# Patient Record
Sex: Male | Born: 1955 | Race: Black or African American | Hispanic: No | Marital: Single | Smoking: Current every day smoker
Health system: Southern US, Community
[De-identification: ages and names within clinical notes are randomized; demographics above are authoritative.]

## PROBLEM LIST (undated history)

## (undated) DIAGNOSIS — W3400XA Accidental discharge from unspecified firearms or gun, initial encounter: Secondary | ICD-10-CM

## (undated) DIAGNOSIS — Y249XXA Unspecified firearm discharge, undetermined intent, initial encounter: Secondary | ICD-10-CM

## (undated) DIAGNOSIS — Z720 Tobacco use: Secondary | ICD-10-CM

## (undated) DIAGNOSIS — I10 Essential (primary) hypertension: Secondary | ICD-10-CM

---

## 2013-03-19 ENCOUNTER — Emergency Department (HOSPITAL_COMMUNITY)
Admission: EM | Admit: 2013-03-19 | Discharge: 2013-03-20 | Disposition: A | Discharge status: Home / Self Care | Payer: Medicare Other | Attending: Emergency Medicine | Admitting: Emergency Medicine

## 2013-03-19 ENCOUNTER — Encounter (HOSPITAL_COMMUNITY): Payer: Self-pay | Admitting: Emergency Medicine

## 2013-03-19 ENCOUNTER — Emergency Department (HOSPITAL_COMMUNITY): Payer: Medicare Other

## 2013-03-19 DIAGNOSIS — M25461 Effusion, right knee: Secondary | ICD-10-CM

## 2013-03-19 DIAGNOSIS — Z79899 Other long term (current) drug therapy: Secondary | ICD-10-CM | POA: Diagnosis not present

## 2013-03-19 DIAGNOSIS — M25469 Effusion, unspecified knee: Secondary | ICD-10-CM | POA: Diagnosis not present

## 2013-03-19 DIAGNOSIS — I10 Essential (primary) hypertension: Secondary | ICD-10-CM | POA: Diagnosis not present

## 2013-03-19 DIAGNOSIS — M25569 Pain in unspecified knee: Secondary | ICD-10-CM | POA: Diagnosis present

## 2013-03-19 NOTE — ED Notes (Signed)
Patient states his right knee started to have intermittent swelling x1 month with sharp pain 10/10. Patient states he was using the leg press machine when he felt like his leg was splitting open. No OTC's taken at home. He has only been applying muscle relaxant cream.

## 2013-03-20 MED ORDER — HYDROCODONE-ACETAMINOPHEN 5-325 MG PO TABS: 1.0000 | ORAL_TABLET | ORAL | Status: DC | PRN

## 2013-03-20 MED ORDER — LIDOCAINE HCL (PF) 1 % IJ SOLN
INTRAMUSCULAR | Status: AC
  Filled 2013-03-20: qty 5

## 2013-03-20 MED ORDER — LIDOCAINE HCL (PF) 1 % IJ SOLN
INTRAMUSCULAR | Status: AC
  Filled 2013-03-20: qty 20

## 2013-03-20 NOTE — ED Provider Notes (Signed)
Medical screening examination/treatment/procedure(s) were performed by non-physician practitioner and as supervising physician I was immediately available for consultation/collaboration.  EKG Interpretation   None         Enid SkeensJoshua M Kortland Nichols, MD 03/20/13 [phone]

## 2013-03-20 NOTE — Discharge Instructions (Signed)

## 2013-03-20 NOTE — ED Provider Notes (Signed)
CSN: [id number]     Arrival date & time 03/19/13  2207 History   First MD Initiated Contact with Patient 03/19/13 2233     Chief Complaint  Patient presents with  . Knee Pain   (Consider location/radiation/quality/duration/timing/severity/associated sxs/prior Treatment) HPI Comments: Patient is 58 year old male with history of HTN who presents to the ED with complaints of right knee pain and swelling progressively over the past month.  He denies any injury to the area, previous surgeries, artifical joints, fever, chills, erythema to the area.  Reports pain with flexion and ambulation.  Patient is a 58 y.o. male presenting with knee pain. The history is provided by the patient. No language interpreter was used.  Knee Pain Location:  Knee Time since incident:  1 month Injury: no   Knee location:  R knee Pain details:    Quality:  Pressure and dull   Radiates to:  Does not radiate   Severity:  Moderate   Onset quality:  Gradual   Duration:  1 month   Timing:  Constant   Progression:  Worsening Chronicity:  New Dislocation: no   Foreign body present:  No foreign bodies Tetanus status:  Up to date Prior injury to area:  No Relieved by:  Nothing Worsened by:  Nothing tried Ineffective treatments:  None tried Associated symptoms: decreased ROM, stiffness and swelling   Associated symptoms: no back pain, no fatigue, no fever, no itching, no muscle weakness, no neck pain, no numbness and no tingling     History reviewed. No pertinent past medical history. History reviewed. No pertinent past surgical history. No family history on file. History  Substance Use Topics  . Smoking status: Never Smoker   . Smokeless tobacco: Not on file  . Alcohol Use: No    Review of Systems  Constitutional: Negative for fever and fatigue.  Musculoskeletal: Positive for stiffness. Negative for back pain and neck pain.  Skin: Negative for itching.  All other systems reviewed and are  negative.    Allergies  Review of patient's allergies indicates no known allergies.  Home Medications   Current Outpatient Rx  Name  Route  Sig  Dispense  Refill  . PRESCRIPTION MEDICATION   Oral   Take 1 tablet by mouth daily. Patient takes a blood pressure med. He doesn't know the name.          BP 112/79  Pulse 64  Temp(Src) 97.5 F (36.4 C) (Oral)  Resp 16  SpO2 98% Physical Exam  Nursing note and vitals reviewed. Constitutional: He is oriented to person, place, and time. He appears well-developed and well-nourished. No distress.  HENT:  Head: Normocephalic.  Eyes: Conjunctivae are normal. No scleral icterus.  Pulmonary/Chest: Effort normal.  Musculoskeletal:       Right knee: He exhibits decreased range of motion, swelling and effusion. He exhibits no erythema, normal alignment, no LCL laxity, normal meniscus and no MCL laxity. Tenderness found. Lateral joint line tenderness noted.  Neurological: He is alert and oriented to person, place, and time. He exhibits normal muscle tone. Coordination normal.  Skin: Skin is warm and dry. No rash noted. No erythema. No pallor.  Psychiatric: He has a normal mood and affect. His behavior is normal. Judgment and thought content normal.    ED Course  Procedures (including critical care time) Labs Review Labs Reviewed - No data to display Imaging Review No results found.  EKG Interpretation   None      No results  found for this or any previous visit. Dg Knee Complete 4 Views Right  03/20/2013   CLINICAL DATA:  Right knee injury, with anterior knee pain and swelling.  EXAM: RIGHT KNEE - COMPLETE 4+ VIEW  COMPARISON:  None.  FINDINGS: There is no evidence of fracture or dislocation. The joint spaces are preserved. Small marginal osteophytes are seen arising from all three compartments.  A large knee joint effusion is noted. Focal soft tissue calcification overlying the medial quadriceps may reflect remote injury.  IMPRESSION:  1. No evidence of fracture or dislocation. 2. Large knee joint effusion. MRI of the right knee could be considered for further evaluation, to exclude internal derangement of the knee.   Electronically Signed   By: Roanna Raider M.D.   On: 03/20/2013 00:26    2:02 AM Patient with large right knee effusion, attempted to drain fluid from the knee.  He was prepped and draped in sterile position with knee slightly flexed.  I attempted to inject 2-3cc of 2% lidocaine to medial superior portion of the knee, patient repeatedly kept swatting my hand, breaking sterile field, so I stopped the procedure before ever entering the joint space, will place in knee immobilizer and offer pain medication and patient can follow up with orthopedics. MDM  Right knee effusion  Patient here with 1 month history of right knee pain and swelling, noted with large effusion both on x-ray and examination, patient uncooperative for arthocentesis, will have him follow up with ortho on outpatient basis.    Izola Price Marisue Humble, PA-C 03/20/13 0205

## 2013-03-20 NOTE — ED Notes (Signed)
Ortho tech paged and returned phone call.  Tech to come to FT to place knee immobilizer.

## 2013-08-13 ENCOUNTER — Encounter (HOSPITAL_COMMUNITY): Payer: Self-pay | Admitting: Emergency Medicine

## 2013-08-13 ENCOUNTER — Emergency Department (HOSPITAL_COMMUNITY): Payer: Medicare Other

## 2013-08-13 DIAGNOSIS — Z880 Allergy status to penicillin: Secondary | ICD-10-CM | POA: Insufficient documentation

## 2013-08-13 DIAGNOSIS — I10 Essential (primary) hypertension: Secondary | ICD-10-CM | POA: Insufficient documentation

## 2013-08-13 DIAGNOSIS — Z79899 Other long term (current) drug therapy: Secondary | ICD-10-CM | POA: Insufficient documentation

## 2013-08-13 DIAGNOSIS — M546 Pain in thoracic spine: Secondary | ICD-10-CM | POA: Insufficient documentation

## 2013-08-13 LAB — BASIC METABOLIC PANEL
BUN: 11 mg/dL (ref 6–23)
CHLORIDE: 99 meq/L (ref 96–112)
CO2: 23 mEq/L (ref 19–32)
Calcium: 9.5 mg/dL (ref 8.4–10.5)
Creatinine, Ser: 1.05 mg/dL (ref 0.50–1.35)
GFR calc non Af Amer: 76 mL/min — ABNORMAL LOW (ref >90)
GFR, EST AFRICAN AMERICAN: 89 mL/min — AB (ref >90)
Glucose, Bld: 90 mg/dL (ref 70–99)
POTASSIUM: 4.6 meq/L (ref 3.7–5.3)
Sodium: 137 mEq/L (ref 137–147)

## 2013-08-13 LAB — CBC
HCT: 41 % (ref 39.0–52.0)
Hemoglobin: 13.2 g/dL (ref 13.0–17.0)
MCH: 27.3 pg (ref 26.0–34.0)
MCHC: 32.2 g/dL (ref 30.0–36.0)
MCV: 84.9 fL (ref 78.0–100.0)
PLATELETS: 301 10*3/uL (ref 150–400)
RBC: 4.83 MIL/uL (ref 4.22–5.81)
RDW: 14.5 % (ref 11.5–15.5)
WBC: 7.8 10*3/uL (ref 4.0–10.5)

## 2013-08-13 LAB — I-STAT TROPONIN, ED: Troponin i, poc: 0 ng/mL (ref 0.00–0.08)

## 2013-08-13 LAB — PRO B NATRIURETIC PEPTIDE: Pro B Natriuretic peptide (BNP): 75.9 pg/mL (ref 0–125)

## 2013-08-13 NOTE — ED Notes (Signed)
Patient presents with c/o CP that radiates to his back.  Stated that he was vacuuming his floor and the pain started.  +SOB  States never hurt like this before

## 2013-08-14 ENCOUNTER — Emergency Department (HOSPITAL_COMMUNITY)
Admission: EM | Admit: 2013-08-14 | Discharge: 2013-08-14 | Disposition: A | Discharge status: Home / Self Care | Payer: Medicare Other | Attending: Emergency Medicine | Admitting: Emergency Medicine

## 2013-08-14 DIAGNOSIS — M549 Dorsalgia, unspecified: Secondary | ICD-10-CM

## 2013-08-14 HISTORY — DX: Essential (primary) hypertension: I10

## 2013-08-14 MED ORDER — TRAMADOL HCL 50 MG PO TABS: 50.0000 mg | ORAL_TABLET | Freq: Four times a day (QID) | ORAL | Status: DC | PRN

## 2013-08-14 MED ORDER — IBUPROFEN 600 MG PO TABS: 600.0000 mg | ORAL_TABLET | Freq: Four times a day (QID) | ORAL | Status: DC | PRN

## 2013-08-14 MED ORDER — TRAMADOL HCL 50 MG PO TABS
50.0000 mg | ORAL_TABLET | Freq: Once | ORAL | Status: AC
  Administered 2013-08-14: 50 mg via ORAL
  Filled 2013-08-14: qty 1

## 2013-08-14 NOTE — ED Notes (Signed)
Pt states new chest pain central radiating to upper back since this morning. No SOB N V.

## 2013-08-14 NOTE — ED Provider Notes (Signed)
CSN: [id number]     Arrival date & time 08/13/13  2056 History   First MD Initiated Contact with Patient 08/14/13 0056     Chief Complaint  Patient presents with  . Chest Pain     (Consider location/radiation/quality/duration/timing/severity/associated sxs/prior Treatment) HPI Patient is a 58 yo man who presents with complaints of mid back pain. He has pain in his upper thoracic region bilaterally. Pain is present only when the patient is in the supine position. He denies history of trauma or strain but, notes that he just drove himself back [place] [facility] [place] [facility]. Pain is aching, mild to moderately severe. The pain is nonradiating. She has no pain at this time. He states he is just tired.  The patient denies experiencing chest pain, shortness of breath, pleuritic pain, genitourinary symptoms, nausea, vomiting or diarrhea.  Past Medical History  Diagnosis Date  . Hypertension    History reviewed. No pertinent past surgical history. History reviewed. No pertinent family history. History  Substance Use Topics  . Smoking status: Never Smoker   . Smokeless tobacco: Not on file  . Alcohol Use: No    Review of Systems Ten point review of symptoms performed and is negative with the exception of symptoms noted above.     Allergies  Penicillins  Home Medications   Prior to Admission medications   Medication Sig Start Date End Date Taking? Authorizing Provider  PRESCRIPTION MEDICATION Take 1 tablet by mouth daily. Patient takes a blood pressure med. He doesn't know the name.   Yes Historical Provider, MD   BP 125/86  Pulse 55  Temp(Src) 98.7 F (37.1 C) (Oral)  Resp 18  Ht 6\' 2"  (1.88 m)  Wt 225 lb (102.059 kg)  BMI 28.88 kg/m2  SpO2 99% Physical Exam Gen: well developed and well nourished appearing Head: NCAT Eyes: PERL, EOMI Nose: no epistaixis or rhinorrhea Mouth/throat: mucosa is moist and pink Neck: supple, no stridor Lungs: CTA B, no wheezing, rhonchi or rales CV:  RRR, no murmur, extremities appear well perfused.  Abd: soft, notender, nondistended Back: no ttp, no cva ttp, ttp over the paraspinal musculature mid to upper back bilaterally.  Skin: warm and dry Ext: normal to inspection, no dependent edema Neuro: CN ii-xii grossly intact, no focal deficits Psyche; normal affect,  calm and cooperative.   ED Course  Procedures (including critical care time) Labs Review  Results for orders placed during the hospital encounter of 08/14/13 (from the past 24 hour(s))  CBC     Status: None   Collection Time    08/13/13  9:08 PM      Result Value Ref Range   WBC 7.8  4.0 - 10.5 K/uL   RBC 4.83  4.22 - 5.81 MIL/uL   Hemoglobin 13.2  13.0 - 17.0 g/dL   HCT 40.941.0  81.139.0 - 91.452.0 %   MCV 84.9  78.0 - 100.0 fL   MCH 27.3  26.0 - 34.0 pg   MCHC 32.2  30.0 - 36.0 g/dL   RDW 78.214.5  95.611.5 - 21.315.5 %   Platelets 301  150 - 400 K/uL  BASIC METABOLIC PANEL     Status: Abnormal   Collection Time    08/13/13  9:08 PM      Result Value Ref Range   Sodium 137  137 - 147 mEq/L   Potassium 4.6  3.7 - 5.3 mEq/L   Chloride 99  96 - 112 mEq/L   CO2 23  19 -  32 mEq/L   Glucose, Bld 90  70 - 99 mg/dL   BUN 11  6 - 23 mg/dL   Creatinine, Ser 1.611.05  0.50 - 1.35 mg/dL   Calcium 9.5  8.4 - 09.610.5 mg/dL   GFR calc non Af Amer 76 (*) >90 mL/min   GFR calc Af Amer 89 (*) >90 mL/min  PRO B NATRIURETIC PEPTIDE     Status: None   Collection Time    08/13/13  9:09 PM      Result Value Ref Range   Pro B Natriuretic peptide (BNP) 75.9  0 - 125 pg/mL  I-STAT TROPOININ, ED     Status: None   Collection Time    08/13/13  9:17 PM      Result Value Ref Range   Troponin i, poc 0.00  0.00 - 0.08 ng/mL   Comment 3             Imaging Review Dg Chest 2 View  08/13/2013   CLINICAL DATA:  Chest pain and shortness of breath.  EXAM: CHEST  2 VIEW  COMPARISON:  None.  FINDINGS: The lungs are well-aerated and clear. There is no evidence of focal opacification, pleural effusion or pneumothorax.   The heart is normal in size; the mediastinal contour is within normal limits. No acute osseous abnormalities are seen.  IMPRESSION: No acute cardiopulmonary process seen.   Electronically Signed   By: Roanna RaiderJeffery  Chang M.D.   On: 08/13/2013 21:55   EKG: nsr, no acute ischemic changes, normal intervals, normal axis, normal qrs complex  MDM   The patient appears to have myofascial pain which is likely secondary to recent long car ride. Pain is reproducible on exam. No signs of pneumonia on CXR. Signs and sx are not consistent with PE. No signs of ACS on EKG and troponin is wnl. No GU sx and no CVA ttp. Abd exam benign. Patient is stable for discharge. [facility] Return precautions discussed.     Brandt LoosenJulie Grisel Blumenstock, MD 08/14/13 [phone]

## 2013-08-14 NOTE — Discharge Instructions (Signed)
Back Pain, Adult  Back pain is very common. The pain often gets better over time. The cause of back pain is usually not dangerous. Most people can learn to manage their back pain on their own.   HOME CARE   · Stay active. Start with short walks on flat ground if you can. Try to walk farther each day.  · Do not sit, drive, or stand in one place for more than 30 minutes. Do not stay in bed.  · Do not avoid exercise or work. Activity can help your back heal faster.  · Be careful when you bend or lift an object. Bend at your knees, keep the object close to you, and do not twist.  · Sleep on a firm mattress. Lie on your side, and bend your knees. If you lie on your back, put a pillow under your knees.  · Only take medicines as told by your doctor.  · Put ice on the injured area.  · Put ice in a plastic bag.  · Place a towel between your skin and the bag.  · Leave the ice on for 15-20 minutes, 03-04 times a day for the first 2 to 3 days. After that, you can switch between ice and heat packs.  · Ask your doctor about back exercises or massage.  · Avoid feeling anxious or stressed. Find good ways to deal with stress, such as exercise.  GET HELP RIGHT AWAY IF:   · Your pain does not go away with rest or medicine.  · Your pain does not go away in 1 week.  · You have new problems.  · You do not feel well.  · The pain spreads into your legs.  · You cannot control when you poop (bowel movement) or pee (urinate).  · Your arms or legs feel weak or lose feeling (numbness).  · You feel sick to your stomach (nauseous) or throw up (vomit).  · You have belly (abdominal) pain.  · You feel like you may pass out (faint).  MAKE SURE YOU:   · Understand these instructions.  · Will watch your condition.  · Will get help right away if you are not doing well or get worse.  Document Released: 08/05/2007 Document Revised: 05/11/2011 Document Reviewed: 07/07/2010  [employer]® Patient Information ©2014 [employer].

## 2013-12-08 ENCOUNTER — Encounter (HOSPITAL_COMMUNITY): Payer: Self-pay | Admitting: Emergency Medicine

## 2013-12-08 ENCOUNTER — Emergency Department (HOSPITAL_COMMUNITY): Payer: Medicare Other

## 2013-12-08 ENCOUNTER — Inpatient Hospital Stay (HOSPITAL_COMMUNITY)
Admission: EM | Admit: 2013-12-08 | Discharge: 2013-12-11 | DRG: 287 | Disposition: A | Discharge status: Home / Self Care | Payer: Medicare Other | Attending: Internal Medicine | Admitting: Internal Medicine

## 2013-12-08 DIAGNOSIS — I4729 Other ventricular tachycardia: Secondary | ICD-10-CM

## 2013-12-08 DIAGNOSIS — Z720 Tobacco use: Secondary | ICD-10-CM | POA: Diagnosis present

## 2013-12-08 DIAGNOSIS — F1290 Cannabis use, unspecified, uncomplicated: Secondary | ICD-10-CM | POA: Diagnosis present

## 2013-12-08 DIAGNOSIS — I472 Ventricular tachycardia: Secondary | ICD-10-CM | POA: Diagnosis present

## 2013-12-08 DIAGNOSIS — R931 Abnormal findings on diagnostic imaging of heart and coronary circulation: Secondary | ICD-10-CM

## 2013-12-08 DIAGNOSIS — N1831 Chronic kidney disease, stage 3a: Secondary | ICD-10-CM

## 2013-12-08 DIAGNOSIS — R9431 Abnormal electrocardiogram [ECG] [EKG]: Secondary | ICD-10-CM

## 2013-12-08 DIAGNOSIS — R001 Bradycardia, unspecified: Secondary | ICD-10-CM | POA: Diagnosis present

## 2013-12-08 DIAGNOSIS — I10 Essential (primary) hypertension: Secondary | ICD-10-CM | POA: Diagnosis present

## 2013-12-08 DIAGNOSIS — I959 Hypotension, unspecified: Secondary | ICD-10-CM | POA: Diagnosis present

## 2013-12-08 DIAGNOSIS — I493 Ventricular premature depolarization: Secondary | ICD-10-CM | POA: Diagnosis present

## 2013-12-08 DIAGNOSIS — N179 Acute kidney failure, unspecified: Secondary | ICD-10-CM

## 2013-12-08 DIAGNOSIS — R55 Syncope and collapse: Principal | ICD-10-CM | POA: Diagnosis present

## 2013-12-08 DIAGNOSIS — Z9119 Patient's noncompliance with other medical treatment and regimen: Secondary | ICD-10-CM | POA: Diagnosis present

## 2013-12-08 DIAGNOSIS — I2510 Atherosclerotic heart disease of native coronary artery without angina pectoris: Secondary | ICD-10-CM | POA: Diagnosis present

## 2013-12-08 DIAGNOSIS — F17210 Nicotine dependence, cigarettes, uncomplicated: Secondary | ICD-10-CM | POA: Diagnosis present

## 2013-12-08 HISTORY — DX: Unspecified firearm discharge, undetermined intent, initial encounter: Y24.9XXA

## 2013-12-08 HISTORY — DX: Accidental discharge from unspecified firearms or gun, initial encounter: W34.00XA

## 2013-12-08 HISTORY — DX: Chronic kidney disease, stage 3a: N18.31

## 2013-12-08 HISTORY — DX: Tobacco use: Z72.0

## 2013-12-08 LAB — CBC WITH DIFFERENTIAL/PLATELET
BASOS PCT: 0 % (ref 0–1)
Basophils Absolute: 0 10*3/uL (ref 0.0–0.1)
EOS PCT: 1 % (ref 0–5)
Eosinophils Absolute: 0.1 10*3/uL (ref 0.0–0.7)
HCT: 44.7 % (ref 39.0–52.0)
Hemoglobin: 14.7 g/dL (ref 13.0–17.0)
Lymphocytes Relative: 38 % (ref 12–46)
Lymphs Abs: 2.1 10*3/uL (ref 0.7–4.0)
MCH: 27.3 pg (ref 26.0–34.0)
MCHC: 32.9 g/dL (ref 30.0–36.0)
MCV: 82.9 fL (ref 78.0–100.0)
MONO ABS: 0.4 10*3/uL (ref 0.1–1.0)
Monocytes Relative: 6 % (ref 3–12)
NEUTROS ABS: 3.1 10*3/uL (ref 1.7–7.7)
Neutrophils Relative %: 55 % (ref 43–77)
Platelets: 318 10*3/uL (ref 150–400)
RBC: 5.39 MIL/uL (ref 4.22–5.81)
RDW: 14.4 % (ref 11.5–15.5)
WBC: 5.6 10*3/uL (ref 4.0–10.5)

## 2013-12-08 LAB — CBC
HCT: 39.6 % (ref 39.0–52.0)
HEMOGLOBIN: 13.3 g/dL (ref 13.0–17.0)
MCH: 27.3 pg (ref 26.0–34.0)
MCHC: 33.6 g/dL (ref 30.0–36.0)
MCV: 81.1 fL (ref 78.0–100.0)
Platelets: 268 10*3/uL (ref 150–400)
RBC: 4.88 MIL/uL (ref 4.22–5.81)
RDW: 14.2 % (ref 11.5–15.5)
WBC: 9.4 10*3/uL (ref 4.0–10.5)

## 2013-12-08 LAB — BASIC METABOLIC PANEL
ANION GAP: 15 (ref 5–15)
BUN: 16 mg/dL (ref 6–23)
CALCIUM: 9.8 mg/dL (ref 8.4–10.5)
CO2: 22 mEq/L (ref 19–32)
CREATININE: 1.47 mg/dL — AB (ref 0.50–1.35)
Chloride: 101 mEq/L (ref 96–112)
GFR calc non Af Amer: 51 mL/min — ABNORMAL LOW (ref >90)
GFR, EST AFRICAN AMERICAN: 59 mL/min — AB (ref >90)
Glucose, Bld: 126 mg/dL — ABNORMAL HIGH (ref 70–99)
Potassium: 4.4 mEq/L (ref 3.7–5.3)
SODIUM: 138 meq/L (ref 137–147)

## 2013-12-08 LAB — I-STAT TROPONIN, ED: Troponin i, poc: 0.01 ng/mL (ref 0.00–0.08)

## 2013-12-08 LAB — RAPID URINE DRUG SCREEN, HOSP PERFORMED
AMPHETAMINES: NOT DETECTED
BENZODIAZEPINES: NOT DETECTED
Barbiturates: NOT DETECTED
Cocaine: NOT DETECTED
Opiates: NOT DETECTED
Tetrahydrocannabinol: POSITIVE — AB

## 2013-12-08 LAB — TROPONIN I

## 2013-12-08 LAB — CREATININE, SERUM
Creatinine, Ser: 1.12 mg/dL (ref 0.50–1.35)
GFR calc Af Amer: 82 mL/min — ABNORMAL LOW (ref >90)
GFR, EST NON AFRICAN AMERICAN: 71 mL/min — AB (ref >90)

## 2013-12-08 LAB — CBG MONITORING, ED: GLUCOSE-CAPILLARY: 136 mg/dL — AB (ref 70–99)

## 2013-12-08 MED ORDER — SODIUM CHLORIDE 0.9 % IV SOLN: INTRAVENOUS | Status: AC

## 2013-12-08 MED ORDER — GUAIFENESIN-DM 100-10 MG/5ML PO SYRP
5.0000 mL | ORAL_SOLUTION | ORAL | Status: DC | PRN
  Filled 2013-12-08: qty 5

## 2013-12-08 MED ORDER — SODIUM CHLORIDE 0.9 % IV BOLUS (SEPSIS)
1000.0000 mL | Freq: Once | INTRAVENOUS | Status: AC
  Administered 2013-12-08: 1000 mL via INTRAVENOUS

## 2013-12-08 MED ORDER — OXYCODONE HCL 5 MG PO TABS: 5.0000 mg | ORAL_TABLET | ORAL | Status: DC | PRN

## 2013-12-08 MED ORDER — ENOXAPARIN SODIUM 40 MG/0.4ML ~~LOC~~ SOLN
40.0000 mg | SUBCUTANEOUS | Status: DC
  Administered 2013-12-08 – 2013-12-10 (×3): 40 mg via SUBCUTANEOUS
  Filled 2013-12-08 (×5): qty 0.4

## 2013-12-08 MED ORDER — ACETAMINOPHEN 325 MG PO TABS: 650.0000 mg | ORAL_TABLET | Freq: Four times a day (QID) | ORAL | Status: DC | PRN

## 2013-12-08 MED ORDER — ONDANSETRON HCL 4 MG/2ML IJ SOLN: 4.0000 mg | Freq: Four times a day (QID) | INTRAMUSCULAR | Status: DC | PRN

## 2013-12-08 MED ORDER — ACETAMINOPHEN 650 MG RE SUPP: 650.0000 mg | Freq: Four times a day (QID) | RECTAL | Status: DC | PRN

## 2013-12-08 MED ORDER — SODIUM CHLORIDE 0.9 % IJ SOLN
3.0000 mL | Freq: Two times a day (BID) | INTRAMUSCULAR | Status: DC
  Administered 2013-12-09 – 2013-12-10 (×2): 3 mL via INTRAVENOUS

## 2013-12-08 MED ORDER — ASPIRIN EC 325 MG PO TBEC
325.0000 mg | DELAYED_RELEASE_TABLET | Freq: Every day | ORAL | Status: DC
  Administered 2013-12-08 – 2013-12-11 (×4): 325 mg via ORAL
  Filled 2013-12-08 (×5): qty 1

## 2013-12-08 MED ORDER — ALUM & MAG HYDROXIDE-SIMETH 200-200-20 MG/5ML PO SUSP: 30.0000 mL | Freq: Four times a day (QID) | ORAL | Status: DC | PRN

## 2013-12-08 MED ORDER — MORPHINE SULFATE 2 MG/ML IJ SOLN: 1.0000 mg | INTRAMUSCULAR | Status: DC | PRN

## 2013-12-08 MED ORDER — ALBUTEROL SULFATE (2.5 MG/3ML) 0.083% IN NEBU: 2.5000 mg | INHALATION_SOLUTION | RESPIRATORY_TRACT | Status: DC | PRN

## 2013-12-08 MED ORDER — SODIUM CHLORIDE 0.9 % IV SOLN
INTRAVENOUS | Status: DC
Start: 2013-12-08 — End: 2013-12-09
  Administered 2013-12-08: 20:00:00 via INTRAVENOUS

## 2013-12-08 MED ORDER — ONDANSETRON HCL 4 MG PO TABS: 4.0000 mg | ORAL_TABLET | Freq: Four times a day (QID) | ORAL | Status: DC | PRN
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
## 2013-12-08 NOTE — ED Notes (Signed)
Resident at bedside.  

## 2013-12-08 NOTE — ED Notes (Signed)
Per EMS, pt was at [facility] when he had a near syncopal episode. Pt A&OX4, NAD noted. Pt denies chest pain or shortness of breath. Pt has abrasions on right hand, pt did not hit head or no LOC. Pt's initial BP 80/60, P80, diaphoretic and cold. 12 lead unremarkable. Pt has h/o HTN.

## 2013-12-08 NOTE — ED Notes (Signed)
Patient transported to X-ray 

## 2013-12-09 ENCOUNTER — Encounter (HOSPITAL_COMMUNITY): Payer: Self-pay | Admitting: Internal Medicine

## 2013-12-09 DIAGNOSIS — R9431 Abnormal electrocardiogram [ECG] [EKG]: Secondary | ICD-10-CM

## 2013-12-09 DIAGNOSIS — N179 Acute kidney failure, unspecified: Secondary | ICD-10-CM | POA: Diagnosis present

## 2013-12-09 DIAGNOSIS — R55 Syncope and collapse: Secondary | ICD-10-CM | POA: Diagnosis present

## 2013-12-09 DIAGNOSIS — I959 Hypotension, unspecified: Secondary | ICD-10-CM | POA: Diagnosis present

## 2013-12-09 DIAGNOSIS — I2510 Atherosclerotic heart disease of native coronary artery without angina pectoris: Secondary | ICD-10-CM | POA: Diagnosis present

## 2013-12-09 DIAGNOSIS — I472 Ventricular tachycardia: Secondary | ICD-10-CM

## 2013-12-09 DIAGNOSIS — F17210 Nicotine dependence, cigarettes, uncomplicated: Secondary | ICD-10-CM | POA: Diagnosis present

## 2013-12-09 DIAGNOSIS — I517 Cardiomegaly: Secondary | ICD-10-CM

## 2013-12-09 DIAGNOSIS — Z9119 Patient's noncompliance with other medical treatment and regimen: Secondary | ICD-10-CM | POA: Diagnosis present

## 2013-12-09 DIAGNOSIS — I10 Essential (primary) hypertension: Secondary | ICD-10-CM | POA: Diagnosis present

## 2013-12-09 DIAGNOSIS — R931 Abnormal findings on diagnostic imaging of heart and coronary circulation: Secondary | ICD-10-CM

## 2013-12-09 DIAGNOSIS — I4729 Other ventricular tachycardia: Secondary | ICD-10-CM

## 2013-12-09 DIAGNOSIS — I493 Ventricular premature depolarization: Secondary | ICD-10-CM | POA: Diagnosis present

## 2013-12-09 DIAGNOSIS — F1290 Cannabis use, unspecified, uncomplicated: Secondary | ICD-10-CM | POA: Diagnosis present

## 2013-12-09 DIAGNOSIS — R001 Bradycardia, unspecified: Secondary | ICD-10-CM | POA: Diagnosis present

## 2013-12-09 LAB — BASIC METABOLIC PANEL
Anion gap: 11 (ref 5–15)
BUN: 14 mg/dL (ref 6–23)
CO2: 25 mEq/L (ref 19–32)
Calcium: 9 mg/dL (ref 8.4–10.5)
Chloride: 104 mEq/L (ref 96–112)
Creatinine, Ser: 1.09 mg/dL (ref 0.50–1.35)
GFR calc Af Amer: 85 mL/min — ABNORMAL LOW (ref >90)
GFR calc non Af Amer: 73 mL/min — ABNORMAL LOW (ref >90)
GLUCOSE: 107 mg/dL — AB (ref 70–99)
Potassium: 4.2 mEq/L (ref 3.7–5.3)
SODIUM: 140 meq/L (ref 137–147)

## 2013-12-09 LAB — LIPID PANEL
CHOL/HDL RATIO: 3.6 ratio
CHOLESTEROL: 159 mg/dL (ref 0–200)
HDL: 44 mg/dL (ref >39)
LDL Cholesterol: 99 mg/dL (ref 0–99)
Triglycerides: 80 mg/dL (ref <150)
VLDL: 16 mg/dL (ref 0–40)

## 2013-12-09 LAB — TROPONIN I: Troponin I: <0.3 ng/mL (ref <0.30)

## 2013-12-09 LAB — CBC
HCT: 39 % (ref 39.0–52.0)
Hemoglobin: 12.7 g/dL — ABNORMAL LOW (ref 13.0–17.0)
MCH: 26.3 pg (ref 26.0–34.0)
MCHC: 32.6 g/dL (ref 30.0–36.0)
MCV: 80.7 fL (ref 78.0–100.0)
PLATELETS: 263 10*3/uL (ref 150–400)
RBC: 4.83 MIL/uL (ref 4.22–5.81)
RDW: 14.1 % (ref 11.5–15.5)
WBC: 8.1 10*3/uL (ref 4.0–10.5)

## 2013-12-09 LAB — HEMOGLOBIN A1C
Hgb A1c MFr Bld: 6.1 % — ABNORMAL HIGH (ref <5.7)
MEAN PLASMA GLUCOSE: 128 mg/dL — AB (ref <117)

## 2013-12-09 LAB — TSH: TSH: 0.413 u[IU]/mL (ref 0.350–4.500)

## 2013-12-09 LAB — D-DIMER, QUANTITATIVE (NOT AT ARMC)

## 2013-12-09 MED ORDER — SODIUM CHLORIDE 0.9 % IJ SOLN: 3.0000 mL | INTRAMUSCULAR | Status: DC | PRN

## 2013-12-09 MED ORDER — ASPIRIN 81 MG PO CHEW
81.0000 mg | CHEWABLE_TABLET | ORAL | Status: AC
  Administered 2013-12-11: 81 mg via ORAL
  Filled 2013-12-09: qty 1

## 2013-12-09 MED ORDER — SODIUM CHLORIDE 0.9 % IV SOLN
1.0000 mL/kg/h | INTRAVENOUS | Status: DC
  Administered 2013-12-11: 1 mL/kg/h via INTRAVENOUS

## 2013-12-09 MED ORDER — SODIUM CHLORIDE 0.9 % IV SOLN: 250.0000 mL | INTRAVENOUS | Status: DC | PRN

## 2013-12-09 MED ORDER — SODIUM CHLORIDE 0.9 % IV SOLN: 1.0000 mL/kg/h | INTRAVENOUS | Status: DC

## 2013-12-09 MED ORDER — SODIUM CHLORIDE 0.9 % IJ SOLN
3.0000 mL | Freq: Two times a day (BID) | INTRAMUSCULAR | Status: DC
Start: 2013-12-09 — End: 2013-12-11
  Administered 2013-12-09 – 2013-12-10 (×3): 3 mL via INTRAVENOUS

## 2013-12-09 NOTE — Progress Notes (Signed)
[facility] PROGRESS NOTE  Jonathon BurnsHenry Harper ONG:[id number] DOB: [birth year] DOA: 12/08/2013 PCP: No PCP Per Patient  Assessment/Plan: Syncope  - possibly cardiac, EKG with new T-wave inversions in inferior and lateral leads -cardiac enzymes negative, orthostatics negative -given new EKG changes will request Cards eval -2D echo pending  . ARF (acute renal failure)  - Likely prerenal-hydrated, and resolved   . HTN (hypertension)  - Blood pressure currently stable  . Tobacco abuse  - Counseled regarding tobacco abuse.  Cannabis use  DVT proph: lovenox  Code Status: Full Code Family Communication: none at bedside Disposition Plan: home pending workup   Consultants:  Cards pending  HPI/Subjective: Feels ok, no chest pain, dyspnea  Objective: Filed Vitals:   12/09/13 0504  BP: 131/90  Pulse: 56  Temp: 98.9 F (37.2 C)  Resp: 17    Intake/Output Summary (Last 24 hours) at 12/09/13 0832 Last data filed at 12/09/13 0540  Gross per 24 hour  Intake    975 ml  Output    300 ml  Net    675 ml   Filed Weights   12/08/13 1838 12/09/13 0504  Weight: 92.398 kg (203 lb 11.2 oz) 92.244 kg (203 lb 5.8 oz)    Exam:   General:  AAOx3  Cardiovascular: S1S2/RRR  Respiratory: CTAB  Abdomen: soft, NT, BS present  Musculoskeletal: no edema c/c   Data Reviewed: Basic Metabolic Panel:  Recent Labs Lab 12/08/13 1515 12/08/13 2024 12/09/13 0116  NA 138  --  140  K 4.4  --  4.2  CL 101  --  104  CO2 22  --  25  GLUCOSE 126*  --  107*  BUN 16  --  14  CREATININE 1.47* 1.12 1.09  CALCIUM 9.8  --  9.0   Liver Function Tests: No results found for this basename: AST, ALT, ALKPHOS, BILITOT, PROT, ALBUMIN,  in the last 168 hours No results found for this basename: LIPASE, AMYLASE,  in the last 168 hours No results found for this basename: AMMONIA,  in the last 168 hours CBC:  Recent Labs Lab 12/08/13 1515 12/08/13 2024 12/09/13 0116  WBC 5.6 9.4 8.1   NEUTROABS 3.1  --   --   HGB 14.7 13.3 12.7*  HCT 44.7 39.6 39.0  MCV 82.9 81.1 80.7  PLT 318 268 263   Cardiac Enzymes:  Recent Labs Lab 12/08/13 2024 12/09/13 0116 12/09/13 0725  TROPONINI <0.30 <0.30 <0.30   BNP (last 3 results)  Recent Labs  08/13/13 2109  PROBNP 75.9   CBG:  Recent Labs Lab 12/08/13 1548  GLUCAP 136*    No results found for this or any previous visit (from the past 240 hour(s)).   Studies: Dg Chest 2 View  12/08/2013   CLINICAL DATA:  Syncope; hypertension  EXAM: CHEST  2 VIEW  COMPARISON:  August 13, 2013  FINDINGS: The lungs are clear. Heart size and pulmonary vascularity are normal. No adenopathy. No bone lesions.  IMPRESSION: No edema or consolidation.   Electronically Signed   By: Bretta BangWilliam  Harper M.D.   On: 12/08/2013 17:21    Scheduled Meds: . aspirin EC  325 mg Oral Daily  . enoxaparin (LOVENOX) injection  40 mg Subcutaneous Q24H  . sodium chloride  3 mL Intravenous Q12H   Continuous Infusions: . sodium chloride 75 mL/hr at 12/08/13 1952   Antibiotics Given (last 72 hours)   None      Principal Problem:   Syncope Active Problems:  ARF (acute renal failure)   HTN (hypertension)   Tobacco abuse    Time spent: 30min    [facility] HospitalJOSEPH,Naomii Kreger  [facility] Pager [phone]. If 7PM-7AM, please contact night-coverage at [email or web address], password [email or web address] 12/09/2013, 8:32 AM  LOS: 1 day

## 2013-12-09 NOTE — Progress Notes (Signed)
Utilization Review completed.  

## 2013-12-09 NOTE — ED Provider Notes (Signed)
I saw and evaluated the patient, reviewed the resident's note and I agree with the findings and plan.   .Face to face Exam:  General:  Awake HEENT:  Atraumatic Resp:  Normal effort Abd:  Nondistended Neuro:No focal weakness Lymph: No adenopathy   EKG is discussed and reviewed with resident  Nelia Shiobert L Maleiyah Releford, MD 12/09/13 2125

## 2013-12-09 NOTE — Progress Notes (Signed)
  Echocardiogram 2D Echocardiogram has been performed.  Janalyn HarderWest, Mayur Duman R 12/09/2013, 9:48 AM
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
## 2013-12-10 DIAGNOSIS — R931 Abnormal findings on diagnostic imaging of heart and coronary circulation: Secondary | ICD-10-CM

## 2013-12-10 MED ORDER — AMLODIPINE BESYLATE 5 MG PO TABS
5.0000 mg | ORAL_TABLET | Freq: Every day | ORAL | Status: DC
  Filled 2013-12-10: qty 1

## 2013-12-10 MED ORDER — AMLODIPINE BESYLATE 5 MG PO TABS
5.0000 mg | ORAL_TABLET | Freq: Two times a day (BID) | ORAL | Status: DC
  Administered 2013-12-10 – 2013-12-11 (×3): 5 mg via ORAL
  Filled 2013-12-10 (×4): qty 1

## 2013-12-10 NOTE — Progress Notes (Signed)
[facility] PROGRESS NOTE  Briscoe BurnsHenry Junod ZOX:[id number] DOB: Feb 03, 1956 DOA: 12/08/2013 PCP: No PCP Per Patient  Assessment/Plan: Syncope  - possibly cardiac, EKG with new T-wave inversions in inferior and lateral leads -cardiac enzymes negative, orthostatics negative -2D echo with EF 50% to 55% with hypokinesis of the basalinferolateral myocardium -[facility]  . ARF (acute renal failure)  - Likely prerenal-hydrated, and resolved   . HTN (hypertension)  - Blood pressure currently stable  . Tobacco abuse  - Counseled regarding tobacco abuse.  Cannabis use  DVT proph: lovenox  Code Status: Full Code Family Communication: none at bedside Disposition Plan: home pending workup   Consultants:  Cards pending  HPI/Subjective: Feels ok, no chest pain, dizziness  Objective: Filed Vitals:   12/10/13 0601  BP: 151/90  Pulse: 55  Temp: 98.6 F (37 C)  Resp: 20    Intake/Output Summary (Last 24 hours) at 12/10/13 1156 Last data filed at 12/10/13 0957  Gross per 24 hour  Intake      3 ml  Output      0 ml  Net      3 ml   Filed Weights   12/08/13 1838 12/09/13 0504 12/10/13 0601  Weight: 92.398 kg (203 lb 11.2 oz) 92.244 kg (203 lb 5.8 oz) 92.32 kg (203 lb 8.5 oz)    Exam:   General:  AAOx3  Cardiovascular: S1S2/RRR  Respiratory: CTAB  Abdomen: soft, NT, BS present  Musculoskeletal: no edema c/c   Data Reviewed: Basic Metabolic Panel:  Recent Labs Lab 12/08/13 1515 12/08/13 2024 12/09/13 0116  NA 138  --  140  K 4.4  --  4.2  CL 101  --  104  CO2 22  --  25  GLUCOSE 126*  --  107*  BUN 16  --  14  CREATININE 1.47* 1.12 1.09  CALCIUM 9.8  --  9.0   Liver Function Tests: No results found for this basename: AST, ALT, ALKPHOS, BILITOT, PROT, ALBUMIN,  in the last 168 hours No results found for this basename: LIPASE, AMYLASE,  in the last 168 hours No results found for this basename: AMMONIA,  in the last 168  hours CBC:  Recent Labs Lab 12/08/13 1515 12/08/13 2024 12/09/13 0116  WBC 5.6 9.4 8.1  NEUTROABS 3.1  --   --   HGB 14.7 13.3 12.7*  HCT 44.7 39.6 39.0  MCV 82.9 81.1 80.7  PLT 318 268 263   Cardiac Enzymes:  Recent Labs Lab 12/08/13 2024 12/09/13 0116 12/09/13 0725  TROPONINI <0.30 <0.30 <0.30   BNP (last 3 results)  Recent Labs  08/13/13 2109  PROBNP 75.9   CBG:  Recent Labs Lab 12/08/13 1548  GLUCAP 136*    No results found for this or any previous visit (from the past 240 hour(s)).   Studies: Dg Chest 2 View  12/08/2013   CLINICAL DATA:  Syncope; hypertension  EXAM: CHEST  2 VIEW  COMPARISON:  August 13, 2013  FINDINGS: The lungs are clear. Heart size and pulmonary vascularity are normal. No adenopathy. No bone lesions.  IMPRESSION: No edema or consolidation.   Electronically Signed   By: Bretta BangWilliam  Woodruff M.D.   On: 12/08/2013 17:21    Scheduled Meds: . amLODipine  5 mg Oral BID  . [START ON 12/11/2013] aspirin  81 mg Oral Pre-Cath  . aspirin EC  325 mg Oral Daily  . enoxaparin (LOVENOX) injection  40 mg Subcutaneous Q24H  . sodium chloride  3 mL Intravenous Q12H  . sodium chloride  3 mL Intravenous Q12H   Continuous Infusions: . [START ON 12/11/2013] sodium chloride     Antibiotics Given (last 72 hours)   None      Principal Problem:   Syncope Active Problems:   ARF (acute renal failure)   HTN (hypertension)   Tobacco abuse   NSVT (nonsustained ventricular tachycardia)   Abnormal ECG   Abnormal echocardiogram    Time spent: 30min    [facility] CenterJOSEPH,Michael Walrath  [facility] Pager [phone]. If 7PM-7AM, please contact night-coverage at [email or web address], password [email or web address] 12/10/2013, 11:56 AM  LOS: 2 days
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
## 2013-12-10 NOTE — Progress Notes (Deleted)
   Subjective: No CP No SOB  No dizziness Objective: Filed Vitals:   12/09/13 1530 12/09/13 2152 12/10/13 0034 12/10/13 0601  BP: 162/93 197/100 147/88 151/90  Pulse: 54 60 52 55  Temp: 98.6 F (37 C) 98.5 F (36.9 C)  98.6 F (37 C)  TempSrc: Oral Oral  Oral  Resp: 16 20  20   Height:      Weight:    203 lb 8.5 oz (92.32 kg)  SpO2: 99% 99%  100%   Weight change: -2.7 oz (-0.078 kg) No intake or output data in the 24 hours ending 12/10/13 0800  General: Alert, awake, oriented x3, in no acute distress Neck:  JVP is normal Heart: Regular rate and rhythm, without murmurs, rubs, gallops.  Lungs: Clear to auscultation.  No rales or wheezes. Exemities:  No edema.   Neuro: Grossly intact, nonfocal.  Tele:  SB, SR   Lab Results: Results for orders placed during the hospital encounter of 12/08/13 (from the past 24 hour(s))  D-DIMER, QUANTITATIVE     Status: None   Collection Time    12/09/13  9:50 AM      Result Value Ref Range   D-Dimer, Quant <0.27  0.00 - 0.48 ug/mL-FEU    Studies/Results: No results found.  Medications:  Reviewed   @PROBHOSP @  1.  Syncope    Patinet has had no recurrence.  Seen by D Bensimhon yesterday.  EKG with lateral T wave inversion (new)   Echo to suggest distal lateral hypokinesis.  Overall LVEF normal.   Will plan for L heart cath tomorrow to define, r/o CAD  2.  HTN  BP high  Was not on anything at home.  WIll increase amlodipine Give prn hydralzine x 1  3.  HCM  Lipids OK  HDL 44, LDL 99    LOS: 2 days   Dietrich PatesPaula Laqueisha Catalina 12/10/2013, 8:00 AM

## 2013-12-11 ENCOUNTER — Encounter (HOSPITAL_COMMUNITY)
Admission: EM | Disposition: A | Discharge status: Home / Self Care | Payer: Self-pay | Source: Home / Self Care | Attending: Internal Medicine

## 2013-12-11 DIAGNOSIS — R931 Abnormal findings on diagnostic imaging of heart and coronary circulation: Secondary | ICD-10-CM

## 2013-12-11 DIAGNOSIS — R55 Syncope and collapse: Secondary | ICD-10-CM

## 2013-12-11 HISTORY — PX: LEFT HEART CATHETERIZATION WITH CORONARY ANGIOGRAM: SHX5451

## 2013-12-11 LAB — PROTIME-INR
INR: 0.97 (ref 0.00–1.49)
PROTHROMBIN TIME: 12.9 s (ref 11.6–15.2)

## 2013-12-11 SURGERY — LEFT HEART CATHETERIZATION WITH CORONARY ANGIOGRAM
Anesthesia: LOCAL

## 2013-12-11 MED ORDER — AMLODIPINE BESYLATE 5 MG PO TABS: 5.0000 mg | ORAL_TABLET | Freq: Every day | ORAL | Status: DC

## 2013-12-11 MED ORDER — VERAPAMIL HCL 2.5 MG/ML IV SOLN
INTRAVENOUS | Status: AC
  Filled 2013-12-11: qty 2

## 2013-12-11 MED ORDER — LIDOCAINE HCL (PF) 1 % IJ SOLN
INTRAMUSCULAR | Status: AC
  Filled 2013-12-11: qty 30

## 2013-12-11 MED ORDER — SODIUM CHLORIDE 0.9 % IV SOLN
1.0000 mL/kg/h | INTRAVENOUS | Status: DC
  Administered 2013-12-11: 1 mL/kg/h via INTRAVENOUS

## 2013-12-11 MED ORDER — NITROGLYCERIN 1 MG/10 ML FOR IR/CATH LAB
INTRA_ARTERIAL | Status: AC
  Filled 2013-12-11: qty 10

## 2013-12-11 MED ORDER — FENTANYL CITRATE 0.05 MG/ML IJ SOLN
INTRAMUSCULAR | Status: AC
  Filled 2013-12-11: qty 2

## 2013-12-11 MED ORDER — HEPARIN (PORCINE) IN NACL 2-0.9 UNIT/ML-% IJ SOLN
INTRAMUSCULAR | Status: AC
  Filled 2013-12-11: qty 1000

## 2013-12-11 MED ORDER — MIDAZOLAM HCL 2 MG/2ML IJ SOLN
INTRAMUSCULAR | Status: AC
  Filled 2013-12-11: qty 2

## 2013-12-11 NOTE — Progress Notes (Signed)
Physician Discharge Summary  Jonathon Harper [id number] DOB: [birth year] DOA: 12/08/2013  PCP: No PCP Per Patient  Admit date: 12/08/2013 Discharge date: 12/11/2013  Time spent: 45 minutes  Recommendations for Outpatient Follow-up:  1. [facility]  Discharge Diagnoses:  Principal Problem:   Syncope Active Problems:   ARF (acute renal failure)   HTN (hypertension)   Tobacco abuse   NSVT (nonsustained ventricular tachycardia)   Abnormal ECG   Abnormal echocardiogram   Discharge Condition: stable  Diet recommendation: low sodium  Filed Weights   12/09/13 0504 12/10/13 0601 12/11/13 0525  Weight: 92.244 kg (203 lb 5.8 oz) 92.32 kg (203 lb 8.5 oz) 92.455 kg (203 lb 13.2 oz)    History of present illness:  Jonathon BurnsHenry Harper is a 58 y.o. male with a Past Medical History of hypertension-noncompliant to medications who presented to the ER after passing out. Per patient, he has a history of hypertension, [place]- he has not established care with her PCP yet. He claimed that he was at a car wash, when he experienced a brief syncopal episode, he may have lost consciousness for a few seconds. He was brought to the emergency room, where EKG showed new T-wave inversions in the inferior lateral beats  Hospital Course:  Syncope  - possibly vasovagal -But his EKG showed new T-wave inversions in inferior and lateral leads  -cardiac enzymes negative, orthostatics negative  -2D echo with EF 50% to 55% with hypokinesis of the basalinferolateral myocardium and hence underwent LHC which showed normal EF and mild non obstructive CAD  . ARF (acute renal failure)  - Likely prerenal-hydrated, and resolved   . HTN (hypertension)  - Blood pressure currently stable - started on Amlodipine   . Tobacco abuse  - Counseled regarding tobacco abuse.      Procedures: LHC: Left ventriculography: Left ventricular systolic function is normal, LVEF  is estimated at 55-65%, there is no significant mitral regurgitation  Final Conclusions:  1. Minor nonobstructive CAD  2. Normal LV function.       Consultations:  Cardiology  Discharge Exam: Filed Vitals:   12/11/13 0911  BP:   Pulse: 59  Temp:   Resp:     General: AAOx3 Cardiovascular: s1s2/RRR Respiratory: CTAB  Discharge Instructions You were cared for by a hospitalist during your hospital stay. If you have any questions about your discharge medications or the care you received while you were in the hospital after you are discharged, you can call the unit and asked to speak with the hospitalist on call if the hospitalist that took care of you is not available. Once you are discharged, your primary care physician will handle any further medical issues. Please note that NO REFILLS for any discharge medications will be authorized once you are discharged, as it is imperative that you return to your primary care physician (or establish a relationship with a primary care physician if you do not have one) for your aftercare needs so that they can reassess your need for medications and monitor your lab values.  Discharge Instructions   Diet - low sodium heart healthy    Complete by:  As directed      Increase activity slowly    Complete by:  As directed           Current Discharge Medication List    START taking these medications   Details  amLODipine (NORVASC) 5 MG tablet Take 1 tablet (5  mg total) by mouth daily. Qty: 30 tablet, Refills: 0       Allergies  Allergen Reactions  . Penicillins     Swells up   Follow-up Information   Follow up with PCP  In 2 weeks.       The results of significant diagnostics from this hospitalization (including imaging, microbiology, ancillary and laboratory) are listed below for reference.    Significant Diagnostic Studies: Dg Chest 2 View  12/08/2013   CLINICAL DATA:  Syncope; hypertension  EXAM: CHEST  2 VIEW  COMPARISON:   August 13, 2013  FINDINGS: The lungs are clear. Heart size and pulmonary vascularity are normal. No adenopathy. No bone lesions.  IMPRESSION: No edema or consolidation.   Electronically Signed   By: Bretta BangWilliam  Woodruff M.D.   On: 12/08/2013 17:21    Microbiology: No results found for this or any previous visit (from the past 240 hour(s)).   Labs: Basic Metabolic Panel:  Recent Labs Lab 12/08/13 1515 12/08/13 2024 12/09/13 0116  NA 138  --  140  K 4.4  --  4.2  CL 101  --  104  CO2 22  --  25  GLUCOSE 126*  --  107*  BUN 16  --  14  CREATININE 1.47* 1.12 1.09  CALCIUM 9.8  --  9.0   Liver Function Tests: No results found for this basename: AST, ALT, ALKPHOS, BILITOT, PROT, ALBUMIN,  in the last 168 hours No results found for this basename: LIPASE, AMYLASE,  in the last 168 hours No results found for this basename: AMMONIA,  in the last 168 hours CBC:  Recent Labs Lab 12/08/13 1515 12/08/13 2024 12/09/13 0116  WBC 5.6 9.4 8.1  NEUTROABS 3.1  --   --   HGB 14.7 13.3 12.7*  HCT 44.7 39.6 39.0  MCV 82.9 81.1 80.7  PLT 318 268 263   Cardiac Enzymes:  Recent Labs Lab 12/08/13 2024 12/09/13 0116 12/09/13 0725  TROPONINI <0.30 <0.30 <0.30   BNP: BNP (last 3 results)  Recent Labs  08/13/13 2109  PROBNP 75.9   CBG:  Recent Labs Lab 12/08/13 1548  GLUCAP 136*       Signed:  Ronny Ruddell  [facility] 12/11/2013, 12:13 PM
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
## 2013-12-11 NOTE — Discharge Instructions (Signed)
Radial Site Care °Refer to this sheet in the next few weeks. These instructions provide you with information on caring for yourself after your procedure. Your caregiver may also give you more specific instructions. Your treatment has been planned according to current medical practices, but problems sometimes occur. Call your caregiver if you have any problems or questions after your procedure. °HOME CARE INSTRUCTIONS °· You may shower the day after the procedure. Remove the bandage (dressing) and gently wash the site with plain soap and water. Gently pat the site dry. °· Do not apply powder or lotion to the site. °· Do not submerge the affected site in water for 3 to 5 days. °· Inspect the site at least twice daily. °· Do not flex or bend the affected arm for 24 hours. °· No lifting over 5 pounds (2.3 kg) for 5 days after your procedure. °· Do not drive home if you are discharged the same day of the procedure. Have someone else drive you. °· You may drive 24 hours after the procedure unless otherwise instructed by your caregiver. °· Do not operate machinery or power tools for 24 hours. °· A responsible adult should be with you for the first 24 hours after you arrive home. °What to expect: °· Any bruising will usually fade within 1 to 2 weeks. °· Blood that collects in the tissue (hematoma) may be painful to the touch. It should usually decrease in size and tenderness within 1 to 2 weeks. °SEEK IMMEDIATE MEDICAL CARE IF: °· You have unusual pain at the radial site. °· You have redness, warmth, swelling, or pain at the radial site. °· You have drainage (other than a small amount of blood on the dressing). °· You have chills. °· You have a fever or persistent symptoms for more than 72 hours. °· You have a fever and your symptoms suddenly get worse. °· Your arm becomes pale, cool, tingly, or numb. °· You have heavy bleeding from the site. Hold pressure on the site. °Document Released: 03/21/2010 Document Revised:  05/11/2011 Document Reviewed: 03/21/2010 °ExitCare® Patient Information ©2015 [employer]. This information is not intended to replace advice given to you by your health care provider. Make sure you discuss any questions you have with your health care provider. ° °

## 2013-12-11 NOTE — Interval H&P Note (Signed)
History and Physical Interval Note:  12/11/2013 9:12 AM  Jonathon BurnsHenry Harper  has presented today for surgery, with the diagnosis of unstable angina  The various methods of treatment have been discussed with the patient and family. After consideration of risks, benefits and other options for treatment, the patient has consented to  Procedure(s): LEFT HEART CATHETERIZATION WITH CORONARY ANGIOGRAM (N/A) as a surgical intervention .  The patient's history has been reviewed, patient examined, no change in status, stable for surgery.  I have reviewed the patient's chart and labs.  Questions were answered to the patient's satisfaction.   Cath Lab Visit (complete for each Cath Lab visit)  Clinical Evaluation Leading to the Procedure:   ACS: No.  Non-ACS:    Anginal Classification: No Symptoms  Anti-ischemic medical therapy: Minimal Therapy (1 class of medications)  Non-Invasive Test Results: Intermediate-risk stress test findings: cardiac mortality 1-3%/year  Prior CABG: No previous CABG        Theron AristaPeter Surgical Center For [facility] LLCJordanMD,FACC 12/11/2013 9:12 AM

## 2013-12-11 NOTE — CV Procedure (Signed)
    Cardiac Catheterization Procedure Note  Name: Jonathon Harper MRN: [id number] DOB: 1955/06/19  Procedure: Left Heart Cath, Selective Coronary Angiography, LV angiography  Indication: 58 yo BM with multiple cardiac risk factors presents with syncope. Echo shows inferolateral hypokinesis.    Procedural Details: The right wrist was prepped, draped, and anesthetized with 1% lidocaine. Using the modified Seldinger technique, a 6 French slender sheath was introduced into the right radial artery. 3 mg of verapamil was administered through the sheath, weight-based unfractionated heparin was administered intravenously. Standard Judkins catheters were used for selective coronary angiography and left ventriculography. Catheter exchanges were performed over an exchange length guidewire. There were no immediate procedural complications. A TR band was used for radial hemostasis at the completion of the procedure.  The patient was transferred to the post catheterization recovery area for further monitoring.  Procedural Findings: Hemodynamics: AO 147/85  mean 110 mm Hg LV 150/14 mm Hg  Coronary angiography: Coronary dominance: left  Left mainstem: Normal.   Left anterior descending (LAD): Mild irregularities less than 10%.   Left circumflex (LCx): 20% stenosis in the proximal LCx. 20% OM2.   Right coronary artery (RCA): small nondominant, normal.   Left ventriculography: Left ventricular systolic function is normal, LVEF is estimated at 55-65%, there is no significant mitral regurgitation   Final Conclusions:   1. Minor nonobstructive CAD 2. Normal LV function.   Recommendations: risk factor modification.  [name], MDFACC  12/11/2013, 9:34 AM
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
## 2013-12-11 NOTE — Care Management Note (Signed)
    Page 1 of 1   12/11/2013     11:48:40 AM CARE MANAGEMENT NOTE 12/11/2013  Patient:  Jonathon BurnsMOORER,Jonathon Harper   Account Number:  [id number]  Date Initiated:  12/11/2013  Documentation initiated by:  GRAVES-BIGELOW,Hayden Mabin  Subjective/Objective Assessment:   Pt admitted for syncope and cp.     Action/Plan:   Plan for d/c home. CM did provide pt with the Health Connect Number and pt to call for PCP. CM spoke to pt in ref to food stamps. Pt to call Social Services. No further needs from CM.   Anticipated DC Date:  12/11/2013   Anticipated DC Plan:  HOME/SELF CARE      DC Planning Services  CM consult      Choice offered to / List presented to:             Status of service:  Completed, signed off Medicare Important Message given?  YES (If response is "NO", the following Medicare IM given date fields will be blank) Date Medicare IM given:  12/11/2013 Medicare IM given by:  GRAVES-BIGELOW,Sha Amer Date Additional Medicare IM given:   Additional Medicare IM given by:    Discharge Disposition:  HOME/SELF CARE  Per UR Regulation:  Reviewed for med. necessity/level of care/duration of stay  If discussed at Long Length of Stay Meetings, dates discussed:    Comments:

## 2013-12-11 NOTE — Progress Notes (Signed)
TR Band removed and pressure dsg applied; RT radial level 0.  No immediate complications.  Advised pt to leave dsg on in place for 24 hours.  Radial site care reviewed with pt and on d/c instructions.

## 2014-02-08 ENCOUNTER — Encounter (HOSPITAL_COMMUNITY): Payer: Self-pay | Admitting: Cardiology

## 2014-10-17 ENCOUNTER — Other Ambulatory Visit: Payer: Self-pay | Admitting: Internal Medicine

## 2014-10-17 ENCOUNTER — Ambulatory Visit
Admission: RE | Admit: 2014-10-17 | Discharge: 2014-10-17 | Disposition: A | Discharge status: Home / Self Care | Payer: Medicare Other | Source: Ambulatory Visit | Attending: Internal Medicine | Admitting: Internal Medicine

## 2014-10-17 DIAGNOSIS — R0989 Other specified symptoms and signs involving the circulatory and respiratory systems: Secondary | ICD-10-CM

## 2015-03-23 ENCOUNTER — Emergency Department (HOSPITAL_COMMUNITY): Payer: Medicare HMO

## 2015-03-23 ENCOUNTER — Emergency Department (HOSPITAL_COMMUNITY)
Admission: EM | Admit: 2015-03-23 | Discharge: 2015-03-23 | Disposition: A | Discharge status: Home / Self Care | Payer: Medicare HMO | Attending: Emergency Medicine | Admitting: Emergency Medicine

## 2015-03-23 ENCOUNTER — Encounter (HOSPITAL_COMMUNITY): Payer: Self-pay | Admitting: *Deleted

## 2015-03-23 DIAGNOSIS — M25561 Pain in right knee: Secondary | ICD-10-CM | POA: Insufficient documentation

## 2015-03-23 DIAGNOSIS — Z880 Allergy status to penicillin: Secondary | ICD-10-CM | POA: Diagnosis not present

## 2015-03-23 DIAGNOSIS — Z79899 Other long term (current) drug therapy: Secondary | ICD-10-CM | POA: Insufficient documentation

## 2015-03-23 DIAGNOSIS — I10 Essential (primary) hypertension: Secondary | ICD-10-CM | POA: Diagnosis not present

## 2015-03-23 DIAGNOSIS — F17210 Nicotine dependence, cigarettes, uncomplicated: Secondary | ICD-10-CM | POA: Diagnosis not present

## 2015-03-23 DIAGNOSIS — Z87828 Personal history of other (healed) physical injury and trauma: Secondary | ICD-10-CM | POA: Insufficient documentation

## 2015-03-23 MED ORDER — IBUPROFEN 800 MG PO TABS: 800.0000 mg | ORAL_TABLET | Freq: Three times a day (TID) | ORAL | Status: DC

## 2015-03-23 NOTE — Discharge Instructions (Signed)
Take your medications as prescribed. I also recommend the elevating your right leg/knee and applying ice for 15-20 minutes 3-4 times daily. He may also apply ace wrap to right knee to help with swelling and pain. Follow-up with the orthopedic office listed above to schedule an appointment for this week. Return to the emergency department if symptoms worsen or new onset of fever, redness, warmth, swelling, drainage, decreased range of motion, numbness, tingling, weakness.   Emergency Department Resource Guide 1) Find a Doctor and Pay Out of Pocket Although you won't have to find out who is covered by your insurance plan, it is a good idea to ask around and get recommendations. You will then need to call the office and see if the doctor you have chosen will accept you as a new patient and what types of options they offer for patients who are self-pay. Some doctors offer discounts or will set up payment plans for their patients who do not have insurance, but you will need to ask so you aren't surprised when you get to your appointment.  2) Contact Your Local Health Department Not all health departments have doctors that can see patients for sick visits, but many do, so it is worth a call to see if yours does. If you don't know where your local health department is, you can check in your phone book. The CDC also has a tool to help you locate your state's health department, and many state websites also have listings of all of their local health departments.  3) Find a Walk-in Clinic If your illness is not likely to be very severe or complicated, you may want to try a walk in clinic. These are popping up all over the country in pharmacies, drugstores, and shopping centers. They're usually staffed by nurse practitioners or physician assistants that have been trained to treat common illnesses and complaints. They're usually fairly quick and inexpensive. However, if you have serious medical issues or chronic  medical problems, these are probably not your best option.  No Primary Care Doctor: - Call Health Connect at  [phone] - they can help you locate a primary care doctor that  accepts your insurance, provides certain services, etc. - Physician Referral Service- [phone]  Chronic Pain Problems: Organization         Address  Phone   Notes  [facility]  [phone] Patients need to be referred by their primary care doctor.   Medication Assistance: Organization         Address  Phone   Notes  [facility] Medication [facility] [address] [place] [place], [place] [address] [phone] --Must be a resident of [facility] -- Must have NO insurance coverage whatsoever (no Medicaid/ Medicare, etc.) -- The pt. MUST have a primary care doctor that directs their care regularly and follows them in the community   Med[facility]ist  [phone]   [employer]  [phone]    Agencies that provide inexpensive medical care: Organization         Address  Phone   Notes  [facility]  [phone]   [facility]    [phone]   [facility] [address] [place], [place] [address] [phone]   [facility] [address] [place] [place] [phone]   Planned Parenthood    [phone]   [facility]    [phone]  Community Health and [place]  [place] [address], [place] Phone:  [phone], Fax:  [phone] Hours of Operation:  9 am - 6 pm, M-F.  Also accepts Medicaid/Medicare and self-pay.  [facility] for [place], [address], [place] Phone: [phone], Fax: [phone]. Hours of Operation:  8:30 am - 5:30 pm, M-F.  Also accepts Medicaid and self-pay.  [facility] [place] [address] [place] Phone: [phone]   [place], [place], [place] [phone],  Ext. 123 Mondays & Thursdays: 7-9 AM.  First 15 patients are seen on a first come, first serve basis.    [place] Providers:  Organization         Address  Phone   Notes  [facility] [address], [place] [phone] Also accepts self-pay patients.  [facility] [id number] [place], [place]  [phone]   [place], [address], [place] [phone]   [facility] [address], [place] [phone]   Lucianne Lei [address], [place]   [phone] Only accepts [place] Access [place] patients after they have their name applied to their card.   Self-Pay (no insurance) in [facility]:  Organization         Address  Phone   Notes  Sickle Cell Patients, [facility] [place] [phone]   [facility] Urgent Care [place] [phone]   [facility] [place]  [place], [place], [place] [phone]   [facility]/Dr. Osei-Bonsu  [address] [place] [address], [place] [phone] Phone number for both [place] and [place] locations is the same.  Urgent Medical and [facility] [address] [place] [phone]   [facility] [address], [place] or [address] [phone]   [facility] [address], [place] [phone], phone; [phone], fax Sees patients 1st and 3rd Saturday of every month.  Must not qualify for public or private insurance (i.e. Medicaid, Medicare, [place] Health Choice, Veterans' Benefits)  Household income should be no more than 200% of the poverty level The clinic cannot treat you if you are pregnant or think you are pregnant  Sexually transmitted diseases are not  treated at the clinic.    Dental Care: Organization         Address  Phone  Notes  [facility] Department of [facility] [place] [phone] Accepts children up to age 60 who are enrolled in [place] or [place]; pregnant women with a Medicaid card; and children who have applied for Medicaid or [place] Health Choice, but were declined, whose parents can pay a reduced fee at time of service.  [facility] Department of [facility]  [address], [place] [phone] Accepts children up to age 20 who are enrolled in [place] or [place]; pregnant women with a Medicaid card; and children who have applied for Medicaid or [place] Health Choice, but were declined, whose parents can pay a reduced fee at time of service.  [place]  [phone]  [place] [phone] Patients are seen by appointment only. Walk-ins are not accepted. [place] will see patients 19 years of age and older. Monday - Tuesday (8am-5pm) Most Wednesdays (8:30-5pm) $30 per visit, cash only  [facility] Adult Dental Access PROGRAM  [address], [facility] [phone] Patients are seen by appointment only. Walk-ins are not accepted. [place] will see patients 68 years of age and older. One Wednesday Evening (Monthly: Volunteer Based).  $30 per visit, cash only  [place]  [phone] for adults; Children under age 42, call Graduate Pediatric Dentistry at [phone]. Children aged 50-14, please call [phone] to request a pediatric application.  Dental services are provided in all areas of dental care including fillings, crowns and bridges, complete and partial dentures, implants, gum treatment, root canals, and extractions. Preventive care is also provided. Treatment is provided to both adults and children. Patients are selected via a lottery and there is  often a waiting list.   [facility] [address] [place]  [phone] [email or web address]   [facility] [address] [place], [place] [phone], Ext. [phone] Second and Fourth Thursday of each month, opens at 6:30 AM; Clinic ends at 9 AM.  Patients are seen on a first-come first-served basis, and a limited number are seen during each clinic.   [facility]  [address] Hillard Danker [place], [place] [phone]   Eligibility Requirements You must have lived in [place], [place], or [place] counties for at least the last three months.   You cannot be eligible for state or federal sponsored [employer], including [employer], [place], or [employer].   You generally cannot be eligible for healthcare insurance through your employer.    How to apply: Eligibility screenings are held every Tuesday and Wednesday afternoon from 1:00 pm until 4:00 pm. You do not need an appointment for the interview!  [facility] [address] [place], [place]   [place]  [place] Department  [place]  [phone]    Behavioral Health Resources in the Community: Intensive Outpatient Programs Organization         Address  Phone  Notes  [place]. [address] [place], [place] [phone]   [facility] Outpatient [address], [place], [place]   ADS: Alcohol & Drug Svcs [address], [place], [place]   [place] [address]  [place], [place] or [phone]   Substance Abuse Resources Organization         Address  Phone  Notes  Alcohol and Drug Services  [phone]   [place]  [phone]   [facility] [place]   Chinita Pester  [phone]   Residential & Outpatient Substance  Abuse Program  [phone]   Psychological Services Organization         Address  Phone  Notes  [facility] [place]  [place]  [phone]   [place] [address] [place] or [phone]    Mobile Crisis Teams Organization         Address  Phone  Notes  [facility], Mobile Crisis Care Unit  [phone]   Assertive Psychotherapeutic Services  [address] [place], [place]  [facility] [address] [place] [phone]    Self-Help/Support Groups Organization         Address  Phone             Notes  [facility]ental Health [facility]oc[facility] of [place] - variety of support groups  [phone]- I7437963 Call for more information  Narcotics Anonymous (NA), Caring [facility] [address], [employer] [place]  2 meetings at this location   Statistician         Address  Phone  Notes  [facility] [address],    [place]  [phone]   [facility]  [address] [place] [address], [place], [place] [phone]   [facility] [address] [place], [place] [phone] Admissions: 8am-3pm M-F  [facility] [address],    [place], [place] [phone]   [facility] [address] [place], [place], [place] [phone]   [facility] [address],  [place], [place] [phone]   [facility] - Intensive Outpatient [address] Laurell [address], [place], [place] [phone]   [facility] (Addiction Recovery Care [facility] [address] [place].,  [place], [place] [phone] or [phone]   [facility] Services (RT[facility]) [address] [place], [place] [phone] Accepts Medicaid  [facility] [place] [address]  [place] [phone] Substance Abuse/Addiction Treatment   [facility] Organization          Address  Phone  Notes  [facility]  [phone]   Angie Fava, PhD [address] Ervin Knack [place], [place]   [phone] or [phone]   [facility]al   [address] [place], [place] [phone]   [facility] [address] [place], [place] [phone] Insurance/Medicaid/sponsorship through [facility] and Families [address]                                    [place], [place] [phone] Therapy/tele-psych/case  [facility] [address], [place] [phone]    Dr. Lolly Mustache  [phone]   [facility]  [employer] [facility] Dept. 1) [address], [place] 2) [address] [place] 3)  [address], [place] [phone]  [phone]   [facility] Child Abuse Hotline [phone] or [phone] (After Hours)

## 2015-03-23 NOTE — ED Notes (Signed)
Pt repots right knee pain and swelling x 1 week. Hx of same. Ambulatory at triage.
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
## 2015-03-23 NOTE — ED Notes (Signed)
Declined W/C at D/C and was escorted to lobby by RN. 

## 2015-03-23 NOTE — ED Notes (Signed)
Gave pt a pair of shorts to put on. Pt only took off his jeans and left his long johns on. Tech told pt in order for provider to properly assess him he needed to take the long johns off. Pt stated that he was "too cold" and that they would pull up. Tech had given him warm blankets and turned the heat up for him but pt still refused.

## 2017-04-18 ENCOUNTER — Emergency Department (HOSPITAL_COMMUNITY)
Admission: EM | Admit: 2017-04-18 | Discharge: 2017-04-18 | Disposition: A | Discharge status: Home / Self Care | Payer: Medicare Other | Attending: Emergency Medicine | Admitting: Emergency Medicine

## 2017-04-18 ENCOUNTER — Encounter (HOSPITAL_COMMUNITY): Payer: Self-pay

## 2017-04-18 ENCOUNTER — Emergency Department (HOSPITAL_COMMUNITY): Payer: Medicare Other

## 2017-04-18 DIAGNOSIS — Z79899 Other long term (current) drug therapy: Secondary | ICD-10-CM | POA: Diagnosis not present

## 2017-04-18 DIAGNOSIS — R2241 Localized swelling, mass and lump, right lower limb: Secondary | ICD-10-CM | POA: Diagnosis present

## 2017-04-18 DIAGNOSIS — I10 Essential (primary) hypertension: Secondary | ICD-10-CM | POA: Insufficient documentation

## 2017-04-18 DIAGNOSIS — F17210 Nicotine dependence, cigarettes, uncomplicated: Secondary | ICD-10-CM | POA: Insufficient documentation

## 2017-04-18 DIAGNOSIS — G8929 Other chronic pain: Secondary | ICD-10-CM | POA: Diagnosis not present

## 2017-04-18 DIAGNOSIS — M25561 Pain in right knee: Secondary | ICD-10-CM | POA: Insufficient documentation

## 2017-04-18 DIAGNOSIS — M25461 Effusion, right knee: Secondary | ICD-10-CM | POA: Diagnosis not present

## 2017-04-18 MED ORDER — DICLOFENAC SODIUM 1 % TD GEL: 2.0000 g | Freq: Four times a day (QID) | TRANSDERMAL | 0 refills | Status: DC

## 2017-04-18 NOTE — ED Triage Notes (Signed)
Rt knee x-ray was done in room
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
## 2017-04-18 NOTE — Discharge Instructions (Signed)
It was my pleasure taking care of you today!   Please see the information and instructions below regarding your visit.  Your diagnoses today include:  1. Effusion of right knee    About diagnosis.  Tests performed today include: See side panel of your discharge paperwork for testing performed today. Vital signs are listed at the bottom of these instructions.   Xray demonstrated fluid in your knee  Medications prescribed:    Take any prescribed medications only as prescribed, and any over the counter medications only as directed on the packaging.  Diclofenac gel as needed for pain. Use crutches as needed for comfort. Ice and elevate knee throughout the day.  Ibuprofen and alcohol followed well.  Please use one or the other.  Home care instructions:  Please follow any educational materials contained in this packet.   Follow-up instructions: Please follow-up with your primary care provider as soon as possible for further evaluation of your symptoms if they are not completely improved.  Please call the patient care center at 9 AM on Tuesday.  They will not be open tomorrow on February 18.  You can set up a primary care provider through them.  Call the orthopedist listed today or tomorrow to schedule follow up appointment for recheck of ongoing knee pain in one to two weeks. That appointment can be canceled with a 24-48 hour notice if complete resolution of pain.   Follow up with the orthopedist listed if symptoms do not improve in one week.   Return instructions:  Please return to the Emergency Department if you experience worsening symptoms.  Please return for any increasing swelling, increasing pain, loss of color to the lower leg, or increasing redness. Please return if you have any other emergent concerns.  Additional Information:   Your vital signs today were: BP 127/87 (BP Location: Right Arm)    Pulse (!) 58    Temp 98.4 F (36.9 C) (Oral)    Resp 12    Ht 6\' 2"  (1.88 m)     Wt 101.6 kg (224 lb)    SpO2 98%    BMI 28.76 kg/m  If your blood pressure (BP) was elevated on multiple readings during this visit above 130 for the top number or above 80 for the bottom number, please have this repeated by your primary care provider within one month. --------------

## 2017-04-18 NOTE — ED Notes (Signed)
Declined W/C at D/C and was escorted to lobby by RN. 

## 2017-04-18 NOTE — ED Triage Notes (Signed)
Patient complains of recurrent right knee swelling x 2 weeks, denies trauma

## 2018-07-11 ENCOUNTER — Encounter (HOSPITAL_COMMUNITY): Payer: Self-pay | Admitting: *Deleted

## 2018-07-11 ENCOUNTER — Other Ambulatory Visit: Payer: Self-pay

## 2018-07-11 ENCOUNTER — Ambulatory Visit (HOSPITAL_COMMUNITY)
Admission: EM | Admit: 2018-07-11 | Discharge: 2018-07-11 | Disposition: A | Discharge status: Home / Self Care | Payer: Medicaid Other | Attending: Family Medicine | Admitting: Family Medicine

## 2018-07-11 DIAGNOSIS — M7989 Other specified soft tissue disorders: Secondary | ICD-10-CM

## 2018-07-11 MED ORDER — TRIAMCINOLONE ACETONIDE 0.1 % EX CREA: 1.0000 "application " | TOPICAL_CREAM | Freq: Two times a day (BID) | CUTANEOUS | 0 refills | Status: DC

## 2018-07-11 NOTE — ED Triage Notes (Signed)
Reports noticing swelling and discomfort to right forearm and hand onset 3 days ago.  No lesions noted.  CMS intact.  Denies fevers or injury.

## 2018-07-11 NOTE — Discharge Instructions (Addendum)
Use the cream as directed.  Follow up as needed for continued or worsening symptoms
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
## 2018-08-22 ENCOUNTER — Encounter: Payer: Self-pay | Admitting: Family Medicine

## 2018-08-22 ENCOUNTER — Ambulatory Visit (INDEPENDENT_AMBULATORY_CARE_PROVIDER_SITE_OTHER): Payer: Medicare HMO | Admitting: Family Medicine

## 2018-08-22 ENCOUNTER — Other Ambulatory Visit: Payer: Self-pay

## 2018-08-22 DIAGNOSIS — M25561 Pain in right knee: Secondary | ICD-10-CM
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
## 2018-09-25 ENCOUNTER — Emergency Department (HOSPITAL_COMMUNITY)
Admission: EM | Admit: 2018-09-25 | Discharge: 2018-09-25 | Disposition: A | Discharge status: Home / Self Care | Payer: Medicare HMO | Attending: Emergency Medicine | Admitting: Emergency Medicine

## 2018-09-25 ENCOUNTER — Emergency Department (HOSPITAL_COMMUNITY): Payer: Medicare HMO

## 2018-09-25 ENCOUNTER — Other Ambulatory Visit: Payer: Self-pay

## 2018-09-25 ENCOUNTER — Encounter (HOSPITAL_COMMUNITY): Payer: Self-pay | Admitting: Emergency Medicine

## 2018-09-25 DIAGNOSIS — M25561 Pain in right knee: Secondary | ICD-10-CM | POA: Insufficient documentation

## 2018-09-25 DIAGNOSIS — Y999 Unspecified external cause status: Secondary | ICD-10-CM | POA: Insufficient documentation

## 2018-09-25 DIAGNOSIS — Y92410 Unspecified street and highway as the place of occurrence of the external cause: Secondary | ICD-10-CM | POA: Diagnosis not present

## 2018-09-25 DIAGNOSIS — I10 Essential (primary) hypertension: Secondary | ICD-10-CM | POA: Insufficient documentation

## 2018-09-25 DIAGNOSIS — M25512 Pain in left shoulder: Secondary | ICD-10-CM | POA: Insufficient documentation

## 2018-09-25 DIAGNOSIS — F17210 Nicotine dependence, cigarettes, uncomplicated: Secondary | ICD-10-CM | POA: Diagnosis not present

## 2018-09-25 DIAGNOSIS — Y9389 Activity, other specified: Secondary | ICD-10-CM | POA: Insufficient documentation

## 2018-09-25 DIAGNOSIS — Z79899 Other long term (current) drug therapy: Secondary | ICD-10-CM | POA: Insufficient documentation

## 2018-09-25 DIAGNOSIS — M7918 Myalgia, other site: Secondary | ICD-10-CM

## 2018-09-25 DIAGNOSIS — R51 Headache: Secondary | ICD-10-CM | POA: Diagnosis not present

## 2018-09-25 DIAGNOSIS — F10220 Alcohol dependence with intoxication, uncomplicated: Secondary | ICD-10-CM | POA: Diagnosis not present

## 2018-09-25 DIAGNOSIS — F10920 Alcohol use, unspecified with intoxication, uncomplicated: Secondary | ICD-10-CM
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
## 2018-09-25 NOTE — ED Notes (Signed)
The patient is too intoxicated to ambulate at this time

## 2018-09-25 NOTE — ED Notes (Signed)
Patient was able to ambulate up and down the hallway with ease.
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
## 2018-09-25 NOTE — ED Notes (Signed)
Patient verbalizes understanding of discharge instructions. Opportunity for questioning and answers were provided. Armband removed by staff, pt discharged from ED.  
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
## 2018-11-09 ENCOUNTER — Encounter: Payer: Self-pay | Admitting: Nurse Practitioner

## 2018-11-16 ENCOUNTER — Ambulatory Visit: Payer: Medicare HMO | Admitting: Nurse Practitioner

## 2019-07-05 ENCOUNTER — Encounter: Payer: Self-pay | Admitting: Physician Assistant

## 2019-07-05 ENCOUNTER — Ambulatory Visit (INDEPENDENT_AMBULATORY_CARE_PROVIDER_SITE_OTHER): Payer: Medicare HMO | Admitting: Physician Assistant

## 2019-07-05 ENCOUNTER — Other Ambulatory Visit: Payer: Self-pay

## 2019-07-05 ENCOUNTER — Ambulatory Visit (INDEPENDENT_AMBULATORY_CARE_PROVIDER_SITE_OTHER): Payer: Medicare HMO

## 2019-07-05 DIAGNOSIS — M25561 Pain in right knee: Secondary | ICD-10-CM

## 2019-07-05 DIAGNOSIS — M1711 Unilateral primary osteoarthritis, right knee: Secondary | ICD-10-CM

## 2019-07-05 MED ORDER — LIDOCAINE HCL 1 % IJ SOLN
3.0000 mL | INTRAMUSCULAR | Status: AC | PRN
  Administered 2019-07-05: 3 mL

## 2019-07-05 MED ORDER — METHYLPREDNISOLONE ACETATE 40 MG/ML IJ SUSP
40.0000 mg | INTRAMUSCULAR | Status: AC | PRN
  Administered 2019-07-05: 17:00:00 40 mg via INTRA_ARTICULAR
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
## 2019-07-19 ENCOUNTER — Ambulatory Visit: Payer: Medicare HMO | Admitting: Physician Assistant

## 2020-06-24 ENCOUNTER — Ambulatory Visit: Payer: Medicare HMO | Admitting: Physician Assistant

## 2020-08-01 ENCOUNTER — Ambulatory Visit: Payer: Medicare Other | Admitting: Physician Assistant

## 2021-01-06 ENCOUNTER — Other Ambulatory Visit: Payer: Self-pay

## 2021-01-06 ENCOUNTER — Ambulatory Visit (HOSPITAL_COMMUNITY)
Admission: EM | Admit: 2021-01-06 | Discharge: 2021-01-06 | Disposition: A | Discharge status: Home / Self Care | Payer: Medicare Other | Attending: Emergency Medicine | Admitting: Emergency Medicine

## 2021-01-06 ENCOUNTER — Encounter (HOSPITAL_COMMUNITY): Payer: Self-pay | Admitting: Emergency Medicine

## 2021-01-06 ENCOUNTER — Ambulatory Visit (INDEPENDENT_AMBULATORY_CARE_PROVIDER_SITE_OTHER): Payer: Medicare Other

## 2021-01-06 DIAGNOSIS — W19XXXA Unspecified fall, initial encounter: Secondary | ICD-10-CM | POA: Diagnosis not present

## 2021-01-06 DIAGNOSIS — S2241XA Multiple fractures of ribs, right side, initial encounter for closed fracture: Secondary | ICD-10-CM

## 2021-01-06 DIAGNOSIS — R079 Chest pain, unspecified: Secondary | ICD-10-CM

## 2021-01-06 MED ORDER — IBUPROFEN 800 MG PO TABS
800.0000 mg | ORAL_TABLET | Freq: Three times a day (TID) | ORAL | 0 refills | Status: AC
Start: 2021-01-06 — End: ?

## 2021-01-06 MED ORDER — TIZANIDINE HCL 2 MG PO TABS: 2.0000 mg | ORAL_TABLET | Freq: Two times a day (BID) | ORAL | 0 refills | Status: DC | PRN

## 2021-01-06 NOTE — Discharge Instructions (Addendum)
Your x-ray today shows a fracture sixth and seventh rib on the right side  These will slowly heal over time and can take months to do so  There is currently no complication of the lungs  Please watch for signs of difficulty breathing, shortness of breath, increased pain with breathing, if these occur at any point please go to the nearest emergency department for further evaluation  May use Zanaflex twice a day, be mindful this may make you drowsy   May use ibuprofen as needed every 8 hours as need  You may wear an abdominal binder as needed for additional support  To help prevent complications please complete deep breathing exercises at least once a day, take deep breath, hold for 10 seconds, slowly exhale through pursed lips, complete 10 times

## 2021-01-06 NOTE — ED Triage Notes (Signed)
Pt reports on Saturday he passed out and fell to ground. C/o right sided rib pains esp with movement and taking a deep breath
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
## 2022-04-06 ENCOUNTER — Other Ambulatory Visit: Payer: Self-pay | Admitting: Student

## 2022-04-06 DIAGNOSIS — F17210 Nicotine dependence, cigarettes, uncomplicated: Secondary | ICD-10-CM

## 2022-04-21 ENCOUNTER — Emergency Department (HOSPITAL_COMMUNITY): Payer: Medicare HMO

## 2022-04-21 ENCOUNTER — Emergency Department (HOSPITAL_COMMUNITY)
Admission: EM | Admit: 2022-04-21 | Discharge: 2022-04-21 | Disposition: A | Discharge status: Home / Self Care | Payer: Medicare HMO | Attending: Emergency Medicine | Admitting: Emergency Medicine

## 2022-04-21 ENCOUNTER — Other Ambulatory Visit: Payer: Self-pay

## 2022-04-21 DIAGNOSIS — Z1152 Encounter for screening for COVID-19: Secondary | ICD-10-CM | POA: Insufficient documentation

## 2022-04-21 DIAGNOSIS — B349 Viral infection, unspecified: Secondary | ICD-10-CM

## 2022-04-21 DIAGNOSIS — R509 Fever, unspecified: Secondary | ICD-10-CM | POA: Diagnosis present

## 2022-04-21 LAB — CBC WITH DIFFERENTIAL/PLATELET
Abs Immature Granulocytes: 0.04 10*3/uL (ref 0.00–0.07)
Basophils Absolute: 0 10*3/uL (ref 0.0–0.1)
Basophils Relative: 0 %
Eosinophils Absolute: 0.1 10*3/uL (ref 0.0–0.5)
Eosinophils Relative: 1 %
HCT: 42.1 % (ref 39.0–52.0)
Hemoglobin: 13 g/dL (ref 13.0–17.0)
Immature Granulocytes: 0 %
Lymphocytes Relative: 16 %
Lymphs Abs: 1.5 10*3/uL (ref 0.7–4.0)
MCH: 26 pg (ref 26.0–34.0)
MCHC: 30.9 g/dL (ref 30.0–36.0)
MCV: 84.2 fL (ref 80.0–100.0)
Monocytes Absolute: 1 10*3/uL (ref 0.1–1.0)
Monocytes Relative: 11 %
Neutro Abs: 7.1 10*3/uL (ref 1.7–7.7)
Neutrophils Relative %: 72 %
Platelets: 286 10*3/uL (ref 150–400)
RBC: 5 MIL/uL (ref 4.22–5.81)
RDW: 15.5 % (ref 11.5–15.5)
WBC: 9.8 10*3/uL (ref 4.0–10.5)
nRBC: 0 % (ref 0.0–0.2)

## 2022-04-21 LAB — COMPREHENSIVE METABOLIC PANEL
ALT: 18 U/L (ref 0–44)
AST: 24 U/L (ref 15–41)
Albumin: 3.8 g/dL (ref 3.5–5.0)
Alkaline Phosphatase: 69 U/L (ref 38–126)
Anion gap: 7 (ref 5–15)
BUN: 9 mg/dL (ref 8–23)
CO2: 25 mmol/L (ref 22–32)
Calcium: 8.7 mg/dL — ABNORMAL LOW (ref 8.9–10.3)
Chloride: 102 mmol/L (ref 98–111)
Creatinine, Ser: 1.05 mg/dL (ref 0.61–1.24)
GFR, Estimated: >60 mL/min (ref >60)
Glucose, Bld: 105 mg/dL — ABNORMAL HIGH (ref 70–99)
Potassium: 4 mmol/L (ref 3.5–5.1)
Sodium: 134 mmol/L — ABNORMAL LOW (ref 135–145)
Total Bilirubin: 0.8 mg/dL (ref 0.3–1.2)
Total Protein: 7.9 g/dL (ref 6.5–8.1)

## 2022-04-21 LAB — RESP PANEL BY RT-PCR (RSV, FLU A&B, COVID)  RVPGX2
Influenza A by PCR: POSITIVE — AB
Influenza B by PCR: NEGATIVE
Resp Syncytial Virus by PCR: NEGATIVE
SARS Coronavirus 2 by RT PCR: NEGATIVE

## 2022-04-21 LAB — LACTIC ACID, PLASMA: Lactic Acid, Venous: 1.3 mmol/L (ref 0.5–1.9)

## 2022-04-21 LAB — CULTURE, BLOOD (ROUTINE X 2)

## 2022-04-21 MED ORDER — OSELTAMIVIR PHOSPHATE 75 MG PO CAPS
75.0000 mg | ORAL_CAPSULE | Freq: Once | ORAL | Status: AC
  Administered 2022-04-21: 75 mg via ORAL
  Filled 2022-04-21: qty 1

## 2022-04-21 MED ORDER — OSELTAMIVIR PHOSPHATE 75 MG PO CAPS: 75.0000 mg | ORAL_CAPSULE | Freq: Two times a day (BID) | ORAL | 0 refills | Status: DC

## 2022-04-21 MED ORDER — ACETAMINOPHEN 500 MG PO TABS
1000.0000 mg | ORAL_TABLET | Freq: Once | ORAL | Status: AC
  Administered 2022-04-21: 1000 mg via ORAL
  Filled 2022-04-21: qty 2

## 2022-04-21 MED ORDER — LACTATED RINGERS IV BOLUS
1000.0000 mL | Freq: Once | INTRAVENOUS | Status: AC
  Administered 2022-04-21: 1000 mL via INTRAVENOUS

## 2022-04-21 NOTE — ED Triage Notes (Signed)
Patient c/o fever, chills and cough started yesterday. Pt report taking cough medicine but not tylenol or advil. Pt denies N/V. Pt a/ox4, ambulatory.

## 2022-04-21 NOTE — ED Provider Notes (Signed)
[place] Provider Note   CSN: [id number] Arrival date & time: 04/21/22  1549     History Chief Complaint  Patient presents with   Fever    HPI Jonathon Harper is a 67 y.o. male presenting for 1 day of fever cough congestion.  He states that he started having a fever last night after going from work.  He endorses URI symptoms.  He denies nausea vomiting syncope shortness of breath.  Denies any chest pain.  Ambulatory tolerating p.o. intake on arrival..   Patient's recorded medical, surgical, social, medication list and allergies were reviewed in the Snapshot window as part of the initial history.   Review of Systems   Review of Systems  Constitutional:  Positive for fever. Negative for chills.  HENT:  Positive for congestion. Negative for ear pain and sore throat.   Eyes:  Negative for pain and visual disturbance.  Respiratory:  Positive for cough. Negative for shortness of breath.   Cardiovascular:  Negative for chest pain and palpitations.  Gastrointestinal:  Negative for abdominal pain and vomiting.  Genitourinary:  Negative for dysuria and hematuria.  Musculoskeletal:  Negative for arthralgias and back pain.  Skin:  Negative for color change and rash.  Neurological:  Negative for seizures and syncope.  All other systems reviewed and are negative.   Physical Exam Updated Vital Signs BP (!) 138/113   Pulse (!) 103   Temp 98.4 F (36.9 C)   Resp 20   Ht 6' 2"$  (1.88 m)   Wt 101 kg   SpO2 100%   BMI 28.59 kg/m  Physical Exam Vitals and nursing note reviewed.  Constitutional:      General: He is not in acute distress.    Appearance: He is well-developed.  HENT:     Head: Normocephalic and atraumatic.  Eyes:     Conjunctiva/sclera: Conjunctivae normal.  Cardiovascular:     Rate and Rhythm: Normal rate and regular rhythm.     Heart sounds: No murmur heard. Pulmonary:     Effort: Pulmonary effort is normal. No  respiratory distress.     Breath sounds: Normal breath sounds.  Abdominal:     Palpations: Abdomen is soft.     Tenderness: There is no abdominal tenderness.  Musculoskeletal:        General: No swelling.     Cervical back: Neck supple.  Skin:    General: Skin is warm and dry.     Capillary Refill: Capillary refill takes less than 2 seconds.  Neurological:     Mental Status: He is alert.  Psychiatric:        Mood and Affect: Mood normal.      ED Course/ Medical Decision Making/ A&P    Procedures Procedures   Medications Ordered in ED Medications  lactated ringers bolus 1,000 mL (0 mLs Intravenous Stopped 04/21/22 1831)  acetaminophen (TYLENOL) tablet 1,000 mg (1,000 mg Oral Given 04/21/22 1650)  oseltamivir (TAMIFLU) capsule 75 mg (75 mg Oral Given 04/21/22 1848)   Medical Decision Making:   Jonathon Harper is a 67 y.o. male who presented to the ED today with subjective fever, cough, congestion detailed above.    Patient's presentation is complicated by their history of advanced age.  Patient placed on continuous vitals and telemetry monitoring while in ED which was reviewed periodically.   Complete initial physical exam performed, notably the patient  was hemodynamically stable in no acute distress.  Posterior oropharynx illuminated and  without obvious swelling or deformity.  Patient is without neck stiffness.    Reviewed and confirmed nursing documentation for past medical history, family history, social history.    Initial Assessment:   With the patient's presentation of fever cough congestion, most likely diagnosis is developing viral upper respiratory infection. Other diagnoses were considered including (but not limited to) peritonsillar abscess, retropharyngeal abscess, pneumonia. These are considered less likely due to history of present illness and physical exam findings.   This is most consistent with an acute life/limb threatening illness complicated by underlying chronic  conditions. Considered meningitis, however patient's symptoms, vital signs, physical exam findings including lack of meningismus seem grossly less consistent at this time. Initial Plan:  Screening labs including CBC and Metabolic panel to evaluate for infectious or metabolic etiology of disease.  Viral screening including COVID/flu testing to evaluate for common viral etiologies that need to be tracked CXR to evaluate for structural/infectious intrathoracic pathology.  Empiric treatment with antipyretics including acetaminophen in ambulatory setting Objective evaluation as below reviewed   Initial Study Results:   Laboratory  All laboratory results reviewed without evidence of clinically relevant pathology.   Radiology:  All images reviewed independently. Agree with radiology report at this time.   DG Chest Portable 1 View  Final Result         Final Assessment and Plan:   On reassessment, patient is ambulatory tolerating p.o. intake in no acute distress.    Flu A positive. Will start on tamiflu.  Patient is currently stable for outpatient care and management with no indication for hospitalization or transfer at this time.  Discussed all findings with patient expressed understanding.  Disposition:  Based on the above findings, I believe patient is stable for discharge.    Patient/family educated about specific return precautions for given chief complaint and symptoms.  Patient/family educated about follow-up with PCP.     Patient/family expressed understanding of return precautions and need for follow-up. Patient spoken to regarding all imaging and laboratory results and appropriate follow up for these results. All education provided in verbal form with additional information in written form. Time was allowed for answering of patient questions. Patient discharged.    Emergency Department Medication Summary:   Medications  lactated ringers bolus 1,000 mL (0 mLs Intravenous Stopped  04/21/22 1831)  acetaminophen (TYLENOL) tablet 1,000 mg (1,000 mg Oral Given 04/21/22 1650)  oseltamivir (TAMIFLU) capsule 75 mg (75 mg Oral Given 04/21/22 1848)     Clinical Impression:  1. Viral illness      Discharge   Final Clinical Impression(s) / ED Diagnoses Final diagnoses:  Viral illness    Rx / DC Orders ED Discharge Orders          Ordered    oseltamivir (TAMIFLU) 75 MG capsule  Every 12 hours        04/21/22 2004              Tretha Sciara, MD 04/21/22 2005

## 2022-04-24 LAB — CULTURE, BLOOD (ROUTINE X 2): Culture: NO GROWTH

## 2022-04-25 LAB — CULTURE, BLOOD (ROUTINE X 2)
Culture: NO GROWTH
Special Requests: ADEQUATE

## 2022-05-12 ENCOUNTER — Inpatient Hospital Stay: Admission: RE | Admit: 2022-05-12 | Payer: Medicare Other | Source: Ambulatory Visit

## 2022-06-09 ENCOUNTER — Ambulatory Visit (INDEPENDENT_AMBULATORY_CARE_PROVIDER_SITE_OTHER): Payer: 59

## 2022-06-09 ENCOUNTER — Ambulatory Visit (HOSPITAL_COMMUNITY)
Admission: EM | Admit: 2022-06-09 | Discharge: 2022-06-09 | Disposition: A | Discharge status: Home / Self Care | Payer: 59 | Attending: Emergency Medicine | Admitting: Emergency Medicine

## 2022-06-09 ENCOUNTER — Encounter (HOSPITAL_COMMUNITY): Payer: Self-pay

## 2022-06-09 DIAGNOSIS — M79644 Pain in right finger(s): Secondary | ICD-10-CM

## 2022-06-09 DIAGNOSIS — S6992XA Unspecified injury of left wrist, hand and finger(s), initial encounter: Secondary | ICD-10-CM

## 2022-06-09 MED ORDER — PREDNISONE 20 MG PO TABS: 40.0000 mg | ORAL_TABLET | Freq: Every day | ORAL | 0 refills | Status: DC

## 2022-06-09 NOTE — ED Triage Notes (Signed)
Pt states he dropped a heavy box on his right middle finger 6 days ago. Finger is swollen.
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
## 2022-06-09 NOTE — Discharge Instructions (Signed)
Today your left middle finger was evaluated  X-ray shows that there is no injury to the bone however as you are unable to straighten your finger on your own I do have concern for injury to the tendon and you will need follow-up with orthopedics  To help reduce swelling which can be seen on x-ray begin prednisone every morning with food for 5 days  A splint has been applied to keep finger in alignment, wear until evaluated by orthopedics  Orthopedics information is on front page, please schedule follow-up appointment within the next week

## 2022-11-01 ENCOUNTER — Emergency Department (HOSPITAL_COMMUNITY): Payer: 59

## 2022-11-01 ENCOUNTER — Emergency Department (HOSPITAL_COMMUNITY)
Admission: EM | Admit: 2022-11-01 | Discharge: 2022-11-01 | Disposition: A | Discharge status: Home / Self Care | Payer: 59 | Attending: Emergency Medicine | Admitting: Emergency Medicine

## 2022-11-01 ENCOUNTER — Other Ambulatory Visit: Payer: Self-pay

## 2022-11-01 ENCOUNTER — Encounter (HOSPITAL_COMMUNITY): Payer: Self-pay

## 2022-11-01 DIAGNOSIS — I700 Atherosclerosis of aorta: Secondary | ICD-10-CM | POA: Diagnosis not present

## 2022-11-01 DIAGNOSIS — M25561 Pain in right knee: Secondary | ICD-10-CM | POA: Diagnosis not present

## 2022-11-01 DIAGNOSIS — M79641 Pain in right hand: Secondary | ICD-10-CM | POA: Diagnosis not present

## 2022-11-01 DIAGNOSIS — M542 Cervicalgia: Secondary | ICD-10-CM | POA: Diagnosis not present

## 2022-11-01 DIAGNOSIS — Y92410 Unspecified street and highway as the place of occurrence of the external cause: Secondary | ICD-10-CM | POA: Diagnosis not present

## 2022-11-01 DIAGNOSIS — Z79899 Other long term (current) drug therapy: Secondary | ICD-10-CM | POA: Insufficient documentation

## 2022-11-01 DIAGNOSIS — I10 Essential (primary) hypertension: Secondary | ICD-10-CM | POA: Diagnosis not present

## 2022-11-01 DIAGNOSIS — S0003XA Contusion of scalp, initial encounter: Secondary | ICD-10-CM | POA: Insufficient documentation

## 2022-11-01 DIAGNOSIS — S0990XA Unspecified injury of head, initial encounter: Secondary | ICD-10-CM | POA: Diagnosis present

## 2022-11-01 DIAGNOSIS — N179 Acute kidney failure, unspecified: Secondary | ICD-10-CM | POA: Insufficient documentation

## 2022-11-01 DIAGNOSIS — S8391XA Sprain of unspecified site of right knee, initial encounter: Secondary | ICD-10-CM | POA: Insufficient documentation

## 2022-11-01 DIAGNOSIS — S62320A Displaced fracture of shaft of second metacarpal bone, right hand, initial encounter for closed fracture: Secondary | ICD-10-CM | POA: Insufficient documentation

## 2022-11-01 LAB — COMPREHENSIVE METABOLIC PANEL
ALT: 20 U/L (ref 0–44)
AST: 32 U/L (ref 15–41)
Albumin: 4.3 g/dL (ref 3.5–5.0)
Alkaline Phosphatase: 85 U/L (ref 38–126)
Anion gap: 14 (ref 5–15)
BUN: 13 mg/dL (ref 8–23)
CO2: 21 mmol/L — ABNORMAL LOW (ref 22–32)
Calcium: 9.3 mg/dL (ref 8.9–10.3)
Chloride: 101 mmol/L (ref 98–111)
Creatinine, Ser: 1.6 mg/dL — ABNORMAL HIGH (ref 0.61–1.24)
GFR, Estimated: 47 mL/min — ABNORMAL LOW (ref >60)
Glucose, Bld: 150 mg/dL — ABNORMAL HIGH (ref 70–99)
Potassium: 3.5 mmol/L (ref 3.5–5.1)
Sodium: 136 mmol/L (ref 135–145)
Total Bilirubin: 1.7 mg/dL — ABNORMAL HIGH (ref 0.3–1.2)
Total Protein: 8.2 g/dL — ABNORMAL HIGH (ref 6.5–8.1)

## 2022-11-01 LAB — I-STAT CG4 LACTIC ACID, ED
Lactic Acid, Venous: 2.9 mmol/L (ref 0.5–1.9)
Lactic Acid, Venous: 1.4 mmol/L (ref 0.5–1.9)

## 2022-11-01 LAB — I-STAT CHEM 8, ED
BUN: 16 mg/dL (ref 8–23)
Calcium, Ion: 1.19 mmol/L (ref 1.15–1.40)
Chloride: 105 mmol/L (ref 98–111)
Creatinine, Ser: 1.5 mg/dL — ABNORMAL HIGH (ref 0.61–1.24)
Glucose, Bld: 148 mg/dL — ABNORMAL HIGH (ref 70–99)
HCT: 45 % (ref 39.0–52.0)
Hemoglobin: 15.3 g/dL (ref 13.0–17.0)
Potassium: 3.6 mmol/L (ref 3.5–5.1)
Sodium: 141 mmol/L (ref 135–145)
TCO2: 23 mmol/L (ref 22–32)

## 2022-11-01 LAB — PROTIME-INR
INR: 1 (ref 0.8–1.2)
Prothrombin Time: 13.1 s (ref 11.4–15.2)

## 2022-11-01 LAB — CBC
HCT: 42 % (ref 39.0–52.0)
Hemoglobin: 13.3 g/dL (ref 13.0–17.0)
MCH: 26.2 pg (ref 26.0–34.0)
MCHC: 31.7 g/dL (ref 30.0–36.0)
MCV: 82.8 fL (ref 80.0–100.0)
Platelets: 294 10*3/uL (ref 150–400)
RBC: 5.07 MIL/uL (ref 4.22–5.81)
RDW: 14.8 % (ref 11.5–15.5)
WBC: 6 10*3/uL (ref 4.0–10.5)
nRBC: 0 % (ref 0.0–0.2)

## 2022-11-01 LAB — SAMPLE TO BLOOD BANK

## 2022-11-01 LAB — ETHANOL: Alcohol, Ethyl (B): <10 mg/dL (ref <10)

## 2022-11-01 MED ORDER — SODIUM CHLORIDE 0.9 % IV BOLUS (SEPSIS)
250.0000 mL | Freq: Once | INTRAVENOUS | Status: AC
  Administered 2022-11-01: 250 mL via INTRAVENOUS

## 2022-11-01 MED ORDER — ONDANSETRON HCL 4 MG/2ML IJ SOLN
4.0000 mg | Freq: Once | INTRAMUSCULAR | Status: AC
  Administered 2022-11-01: 4 mg via INTRAVENOUS
  Filled 2022-11-01: qty 2

## 2022-11-01 MED ORDER — SODIUM CHLORIDE 0.9 % IV SOLN: INTRAVENOUS | Status: DC

## 2022-11-01 MED ORDER — FENTANYL CITRATE PF 50 MCG/ML IJ SOSY
50.0000 ug | PREFILLED_SYRINGE | Freq: Once | INTRAMUSCULAR | Status: AC
  Administered 2022-11-01: 50 ug via INTRAVENOUS
  Filled 2022-11-01: qty 1

## 2022-11-01 MED ORDER — OXYCODONE HCL 5 MG PO TABS
5.0000 mg | ORAL_TABLET | Freq: Four times a day (QID) | ORAL | 0 refills | Status: AC | PRN
Start: 2022-11-01 — End: 2022-11-06

## 2022-11-01 MED ORDER — SODIUM CHLORIDE 0.9 % IV BOLUS
1000.0000 mL | Freq: Once | INTRAVENOUS | Status: AC
  Administered 2022-11-01: 1000 mL via INTRAVENOUS

## 2022-11-01 MED ORDER — IOHEXOL 350 MG/ML SOLN
75.0000 mL | Freq: Once | INTRAVENOUS | Status: AC | PRN
  Administered 2022-11-01: 75 mL via INTRAVENOUS

## 2022-11-01 MED ORDER — SODIUM CHLORIDE 0.9 % IV SOLN: 1000.0000 mL | INTRAVENOUS | Status: DC
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
## 2022-11-01 NOTE — ED Notes (Signed)
Warm blankets placed on patient 
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
## 2022-11-01 NOTE — Progress Notes (Signed)
Orthopedic Tech Progress Note Patient Details:  Jonathon Harper [birth year] [id number]  Well-padded radial gutter splint applied in best obtainable fashion. Sensation and ROM of digits remain intact following splinting. Ice and elevation encouraged to help with pain/swelling. Pt is aware of needing to f/u with orthopedics.  Patient ID: Jonathon Harper, male   DOB: [birth year], 67 y.o.   MRN: [id number]  Docia Furl 11/01/2022, 1:35 PM
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
## 2022-11-03 ENCOUNTER — Encounter: Payer: Self-pay | Admitting: Physician Assistant

## 2022-11-03 ENCOUNTER — Ambulatory Visit (INDEPENDENT_AMBULATORY_CARE_PROVIDER_SITE_OTHER): Payer: 59 | Admitting: Physician Assistant

## 2022-11-03 DIAGNOSIS — S62309A Unspecified fracture of unspecified metacarpal bone, initial encounter for closed fracture: Secondary | ICD-10-CM | POA: Insufficient documentation

## 2022-11-03 DIAGNOSIS — S62209A Unspecified fracture of first metacarpal bone, unspecified hand, initial encounter for closed fracture: Secondary | ICD-10-CM

## 2022-11-03 MED ORDER — OXYCODONE-ACETAMINOPHEN 5-325 MG PO TABS
1.0000 | ORAL_TABLET | Freq: Four times a day (QID) | ORAL | 0 refills | Status: AC | PRN
Start: 2022-11-03 — End: ?
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
## 2022-11-09 ENCOUNTER — Ambulatory Visit (INDEPENDENT_AMBULATORY_CARE_PROVIDER_SITE_OTHER): Payer: 59 | Admitting: Physician Assistant

## 2022-11-09 ENCOUNTER — Encounter: Payer: Self-pay | Admitting: Physician Assistant

## 2022-11-09 ENCOUNTER — Ambulatory Visit: Payer: 59 | Admitting: Orthopedic Surgery

## 2022-11-09 ENCOUNTER — Other Ambulatory Visit (INDEPENDENT_AMBULATORY_CARE_PROVIDER_SITE_OTHER): Payer: 59

## 2022-11-09 DIAGNOSIS — S62201A Unspecified fracture of first metacarpal bone, right hand, initial encounter for closed fracture: Secondary | ICD-10-CM | POA: Diagnosis not present

## 2022-11-09 DIAGNOSIS — S62209A Unspecified fracture of first metacarpal bone, unspecified hand, initial encounter for closed fracture: Secondary | ICD-10-CM
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
## 2022-11-30 ENCOUNTER — Encounter: Payer: Self-pay | Admitting: Physician Assistant

## 2022-11-30 ENCOUNTER — Other Ambulatory Visit (INDEPENDENT_AMBULATORY_CARE_PROVIDER_SITE_OTHER): Payer: 59

## 2022-11-30 ENCOUNTER — Other Ambulatory Visit: Payer: Self-pay

## 2022-11-30 ENCOUNTER — Ambulatory Visit: Payer: 59 | Admitting: Physician Assistant

## 2022-11-30 DIAGNOSIS — S62209A Unspecified fracture of first metacarpal bone, unspecified hand, initial encounter for closed fracture: Secondary | ICD-10-CM

## 2022-11-30 DIAGNOSIS — S62201A Unspecified fracture of first metacarpal bone, right hand, initial encounter for closed fracture: Secondary | ICD-10-CM | POA: Diagnosis not present

## 2022-11-30 MED ORDER — OXYCODONE-ACETAMINOPHEN 5-325 MG PO TABS: 1.0000 | ORAL_TABLET | Freq: Three times a day (TID) | ORAL | 0 refills | Status: DC | PRN
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
## 2022-12-14 ENCOUNTER — Ambulatory Visit: Payer: 59 | Admitting: Physician Assistant

## 2022-12-14 ENCOUNTER — Other Ambulatory Visit (INDEPENDENT_AMBULATORY_CARE_PROVIDER_SITE_OTHER): Payer: 59

## 2022-12-14 ENCOUNTER — Encounter: Payer: Self-pay | Admitting: Physician Assistant

## 2022-12-14 DIAGNOSIS — S62310A Displaced fracture of base of second metacarpal bone, right hand, initial encounter for closed fracture: Secondary | ICD-10-CM

## 2022-12-14 DIAGNOSIS — S62209A Unspecified fracture of first metacarpal bone, unspecified hand, initial encounter for closed fracture: Secondary | ICD-10-CM

## 2022-12-14 DIAGNOSIS — S62201A Unspecified fracture of first metacarpal bone, right hand, initial encounter for closed fracture: Secondary | ICD-10-CM
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
## 2022-12-30 ENCOUNTER — Ambulatory Visit: Payer: 59 | Attending: Physician Assistant | Admitting: Occupational Therapy

## 2022-12-30 DIAGNOSIS — M6281 Muscle weakness (generalized): Secondary | ICD-10-CM | POA: Insufficient documentation

## 2022-12-30 DIAGNOSIS — M79641 Pain in right hand: Secondary | ICD-10-CM | POA: Diagnosis present

## 2022-12-30 DIAGNOSIS — R600 Localized edema: Secondary | ICD-10-CM | POA: Insufficient documentation

## 2022-12-30 DIAGNOSIS — M25641 Stiffness of right hand, not elsewhere classified: Secondary | ICD-10-CM | POA: Diagnosis present

## 2022-12-30 DIAGNOSIS — S62310A Displaced fracture of base of second metacarpal bone, right hand, initial encounter for closed fracture: Secondary | ICD-10-CM | POA: Diagnosis not present

## 2022-12-30 DIAGNOSIS — R278 Other lack of coordination: Secondary | ICD-10-CM | POA: Insufficient documentation
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
## 2022-12-30 NOTE — Patient Instructions (Signed)
Flexor Tendon Gliding (Active Hook Fist)   With fingers and knuckles straight, bend middle and tip joints. Do not bend large knuckles. Repeat _10-15___ times. Do _4-6___ sessions per day.  MP Flexion (Active)   With back of hand on table, bend large knuckles as far as they will go, keeping small joints straight. Repeat _10-15___ times. Do __4-6__ sessions per day. Activity: Reach into a narrow container.*      Finger Flexion / Extension   With palm up, bend fingers of left hand toward palm, making a  fist. Straighten fingers, opening fist. Repeat sequence _10-15___ times per session. Do _4-6__ sessions per day. Hand Variation: Palm down   Copyright  VHI. All rights reserved.   AROM: Wrist Extension   .  With _right___ palm down, bend wrist up. Repeat __15__ times per set.  Do __2-3_ sessions per day.    AROM: PIP Flexion / Extension   Pinch bottom knuckle of ___each_____ finger of hand to prevent bending. Actively bend middle knuckle until stretch is felt. Hold __5__ seconds. Relax. Straighten finger as far as possible. Repeat __10-15__ times per set. Do _4-6___ sessions per day.   AROM: DIP Flexion / Extension   AROM: Finger Flexion / Extension   Actively bend fingers of  hand. Start with knuckles furthest from palm, and slowly make a fist. Hold __5__ seconds. Relax. Then straighten fingers as far as possible. Repeat _10-15___ times per set.  Do _4-6___ sessions per day.  Copyright  VHI. All rights reserved.

## 2023-01-04 ENCOUNTER — Ambulatory Visit: Payer: 59 | Attending: Physician Assistant | Admitting: Occupational Therapy

## 2023-01-04 DIAGNOSIS — M25641 Stiffness of right hand, not elsewhere classified: Secondary | ICD-10-CM | POA: Insufficient documentation

## 2023-01-04 DIAGNOSIS — R278 Other lack of coordination: Secondary | ICD-10-CM | POA: Diagnosis present

## 2023-01-04 DIAGNOSIS — R600 Localized edema: Secondary | ICD-10-CM | POA: Insufficient documentation

## 2023-01-04 DIAGNOSIS — M6281 Muscle weakness (generalized): Secondary | ICD-10-CM | POA: Insufficient documentation

## 2023-01-04 DIAGNOSIS — M79641 Pain in right hand: Secondary | ICD-10-CM | POA: Diagnosis present

## 2023-01-04 NOTE — Patient Instructions (Addendum)
PROM: Finger MP Joints   Passively bend ___each_____ finger of hand at big knuckle until stretch is felt. Hold _10___ seconds. Relax. Straighten finger as far as possible. Repeat __5__ times per set.  Do __3-4x day MP / PIP / DIP Composite Flexion (Passive Stretch)    Use other hand to bend ___index and middle___ finger at all three joints. Hold __3__ seconds. Repeat __10__ times. Do __3__ sessions per day.      Copyright  VHI. All rights reserved.
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
## 2023-01-06 ENCOUNTER — Ambulatory Visit: Payer: 59 | Admitting: Occupational Therapy

## 2023-01-06 ENCOUNTER — Encounter: Payer: Self-pay | Admitting: Occupational Therapy

## 2023-01-06 DIAGNOSIS — R278 Other lack of coordination: Secondary | ICD-10-CM

## 2023-01-06 DIAGNOSIS — M6281 Muscle weakness (generalized): Secondary | ICD-10-CM

## 2023-01-06 DIAGNOSIS — M25641 Stiffness of right hand, not elsewhere classified: Secondary | ICD-10-CM | POA: Diagnosis not present

## 2023-01-06 DIAGNOSIS — M79641 Pain in right hand: Secondary | ICD-10-CM

## 2023-01-06 DIAGNOSIS — R600 Localized edema: Secondary | ICD-10-CM
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
## 2023-01-11 ENCOUNTER — Encounter: Payer: Self-pay | Admitting: Occupational Therapy

## 2023-01-11 ENCOUNTER — Ambulatory Visit: Payer: 59 | Admitting: Occupational Therapy

## 2023-01-11 ENCOUNTER — Ambulatory Visit: Payer: 59 | Admitting: Physician Assistant

## 2023-01-11 DIAGNOSIS — M25641 Stiffness of right hand, not elsewhere classified: Secondary | ICD-10-CM

## 2023-01-11 DIAGNOSIS — R278 Other lack of coordination: Secondary | ICD-10-CM

## 2023-01-11 DIAGNOSIS — R600 Localized edema: Secondary | ICD-10-CM

## 2023-01-11 DIAGNOSIS — M6281 Muscle weakness (generalized): Secondary | ICD-10-CM
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
## 2023-01-13 ENCOUNTER — Ambulatory Visit: Payer: 59 | Admitting: Occupational Therapy

## 2023-01-13 DIAGNOSIS — R600 Localized edema: Secondary | ICD-10-CM

## 2023-01-13 DIAGNOSIS — M25641 Stiffness of right hand, not elsewhere classified: Secondary | ICD-10-CM | POA: Diagnosis not present

## 2023-01-13 DIAGNOSIS — R278 Other lack of coordination: Secondary | ICD-10-CM

## 2023-01-13 DIAGNOSIS — M6281 Muscle weakness (generalized): Secondary | ICD-10-CM

## 2023-01-13 DIAGNOSIS — M79641 Pain in right hand: Secondary | ICD-10-CM

## 2023-01-13 NOTE — Patient Instructions (Signed)
1. Grip Strengthening (Resistive Putty)   Squeeze putty using thumb and all fingers. Repeat _20___ times. Do __2__ sessions per day.      Finger / Thumb Activities: Extension    Roll putty into rope shape using all fingers held straight. Hitchhike with thumb up and out.      Pinch putty with right thumb and each fingertip in turn. Repeat __10__ times. Do ___2_ sessions per day. Activity: Peel fruit such as lemons or oranges.* Peel stickers off surfaces.  Copyright  VHI. All rights reserved.    Perfrom the pen rolling exercises using foam roll, grasp with finger tips and roll down and then back to all fingertips 10 reps each 2x day   Copyright  VHI. All rights reserved.
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
## 2023-01-15 ENCOUNTER — Ambulatory Visit: Payer: 59 | Admitting: Physician Assistant

## 2023-01-18 ENCOUNTER — Ambulatory Visit: Payer: 59 | Admitting: Occupational Therapy

## 2023-01-18 DIAGNOSIS — M79641 Pain in right hand: Secondary | ICD-10-CM

## 2023-01-18 DIAGNOSIS — M25641 Stiffness of right hand, not elsewhere classified: Secondary | ICD-10-CM | POA: Diagnosis not present

## 2023-01-18 DIAGNOSIS — R278 Other lack of coordination: Secondary | ICD-10-CM

## 2023-01-18 DIAGNOSIS — M6281 Muscle weakness (generalized): Secondary | ICD-10-CM

## 2023-01-18 DIAGNOSIS — R600 Localized edema: Secondary | ICD-10-CM
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
## 2023-01-19 ENCOUNTER — Encounter: Payer: Self-pay | Admitting: Physician Assistant

## 2023-01-19 ENCOUNTER — Ambulatory Visit (INDEPENDENT_AMBULATORY_CARE_PROVIDER_SITE_OTHER): Payer: 59 | Admitting: Physician Assistant

## 2023-01-19 DIAGNOSIS — S6291XA Unspecified fracture of right wrist and hand, initial encounter for closed fracture: Secondary | ICD-10-CM | POA: Insufficient documentation

## 2023-01-19 DIAGNOSIS — S6291XD Unspecified fracture of right wrist and hand, subsequent encounter for fracture with routine healing: Secondary | ICD-10-CM
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
## 2023-01-20 ENCOUNTER — Ambulatory Visit: Payer: 59 | Admitting: Occupational Therapy

## 2023-01-25 ENCOUNTER — Ambulatory Visit: Payer: 59 | Admitting: Occupational Therapy

## 2023-01-26 ENCOUNTER — Ambulatory Visit: Payer: 59 | Admitting: Occupational Therapy

## 2023-01-26 DIAGNOSIS — M25641 Stiffness of right hand, not elsewhere classified: Secondary | ICD-10-CM

## 2023-01-26 DIAGNOSIS — M6281 Muscle weakness (generalized): Secondary | ICD-10-CM

## 2023-01-26 DIAGNOSIS — M79641 Pain in right hand: Secondary | ICD-10-CM

## 2023-01-26 DIAGNOSIS — R278 Other lack of coordination: Secondary | ICD-10-CM

## 2023-01-26 DIAGNOSIS — R600 Localized edema: Secondary | ICD-10-CM
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
## 2023-02-01 ENCOUNTER — Encounter: Payer: Self-pay | Admitting: Occupational Therapy

## 2023-02-01 ENCOUNTER — Ambulatory Visit: Payer: 59 | Attending: Physician Assistant | Admitting: Occupational Therapy

## 2023-02-01 DIAGNOSIS — M25641 Stiffness of right hand, not elsewhere classified: Secondary | ICD-10-CM | POA: Insufficient documentation

## 2023-02-01 DIAGNOSIS — M79641 Pain in right hand: Secondary | ICD-10-CM | POA: Diagnosis present

## 2023-02-01 DIAGNOSIS — R278 Other lack of coordination: Secondary | ICD-10-CM | POA: Diagnosis present

## 2023-02-01 DIAGNOSIS — M6281 Muscle weakness (generalized): Secondary | ICD-10-CM | POA: Insufficient documentation

## 2023-02-01 DIAGNOSIS — R600 Localized edema: Secondary | ICD-10-CM | POA: Insufficient documentation
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
## 2023-02-02 ENCOUNTER — Ambulatory Visit: Payer: 59 | Admitting: Occupational Therapy

## 2023-02-09 ENCOUNTER — Ambulatory Visit: Payer: 59 | Admitting: Occupational Therapy

## 2023-02-09 DIAGNOSIS — M6281 Muscle weakness (generalized): Secondary | ICD-10-CM

## 2023-02-09 DIAGNOSIS — R600 Localized edema: Secondary | ICD-10-CM

## 2023-02-09 DIAGNOSIS — M25641 Stiffness of right hand, not elsewhere classified: Secondary | ICD-10-CM

## 2023-02-09 DIAGNOSIS — R278 Other lack of coordination: Secondary | ICD-10-CM

## 2023-02-09 DIAGNOSIS — M79641 Pain in right hand: Secondary | ICD-10-CM
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
## 2023-02-09 NOTE — Patient Instructions (Addendum)
02/09/23   To whom it may concern,  Briscoe Burns (DOB:2056-01-05) has completed occupational therapy on 02/09/23.   Best regards,     Keene Breath OTR/L [facility]. [place], [place], [address] Phone: [phone]   Fax:  [phone]

## 2023-02-16 ENCOUNTER — Ambulatory Visit: Payer: 59 | Admitting: Occupational Therapy

## 2023-02-22 ENCOUNTER — Ambulatory Visit: Payer: 59 | Admitting: Occupational Therapy

## 2023-04-27 IMAGING — DX DG RIBS W/ CHEST 3+V*R*
5 series · 5 of 5 positions shown · non-contrast
Comparison: 09/25/2018

CLINICAL DATA: Fall with right-sided chest pain.  Rib pain

EXAM:
RIGHT RIBS AND CHEST - 3+ VIEW

[chest pa]
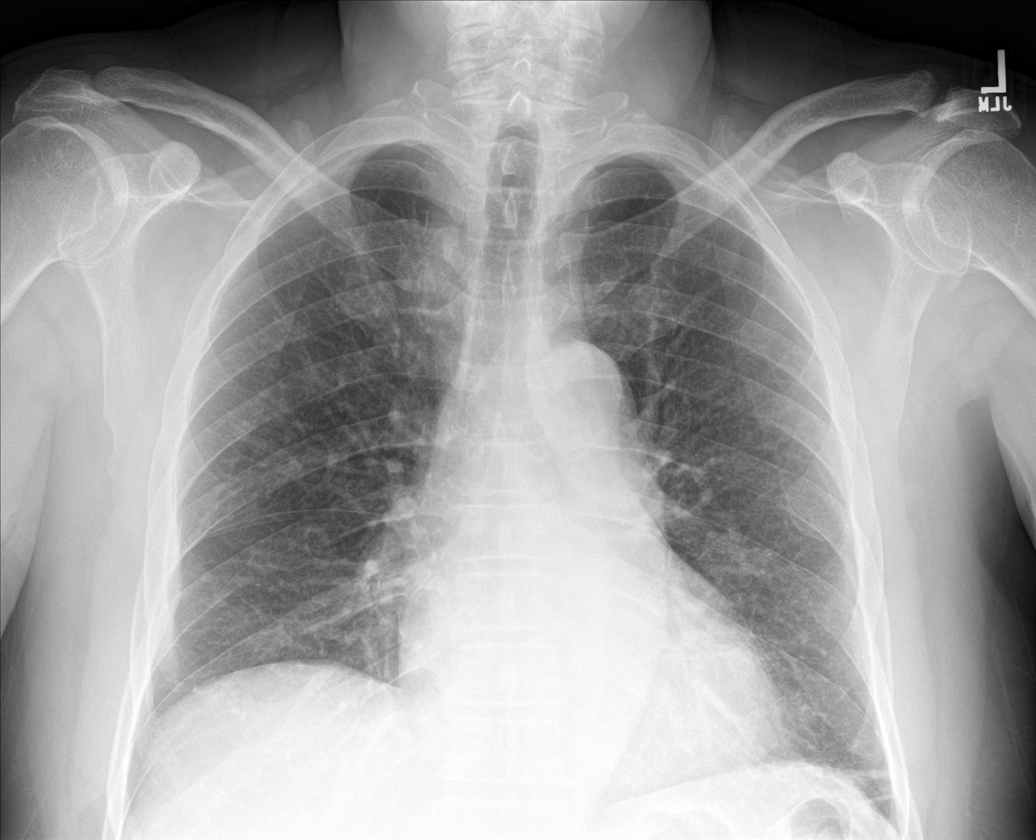

[rib pa]
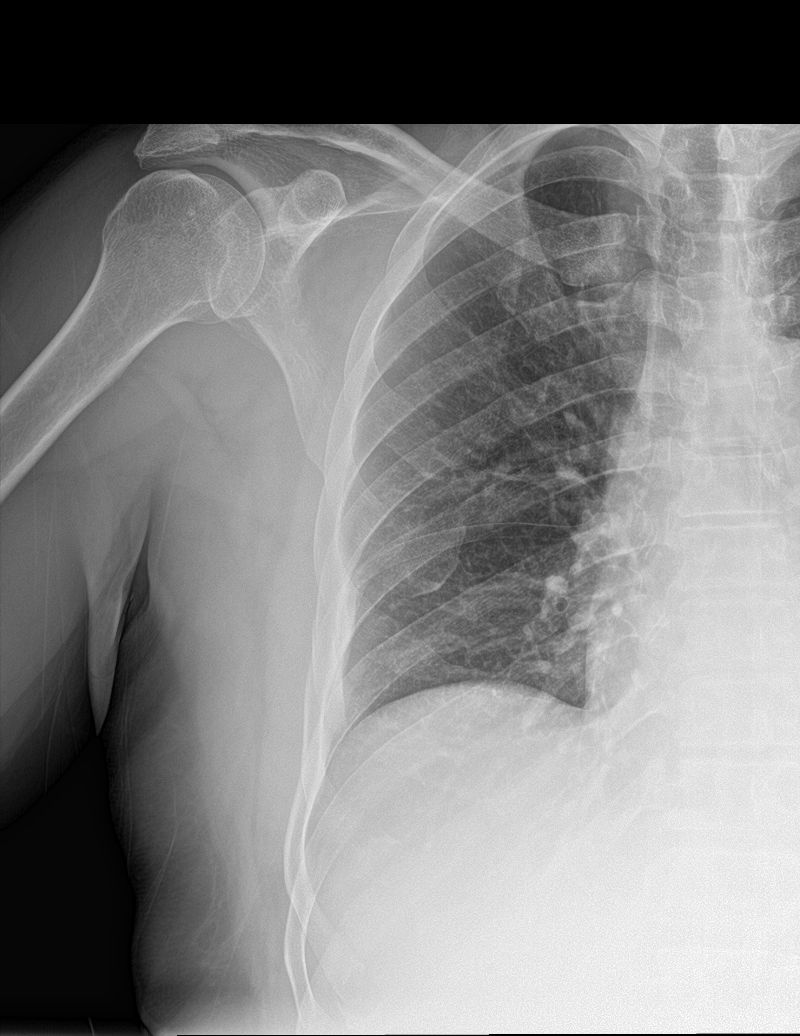

[rib obl (1 of 3)]
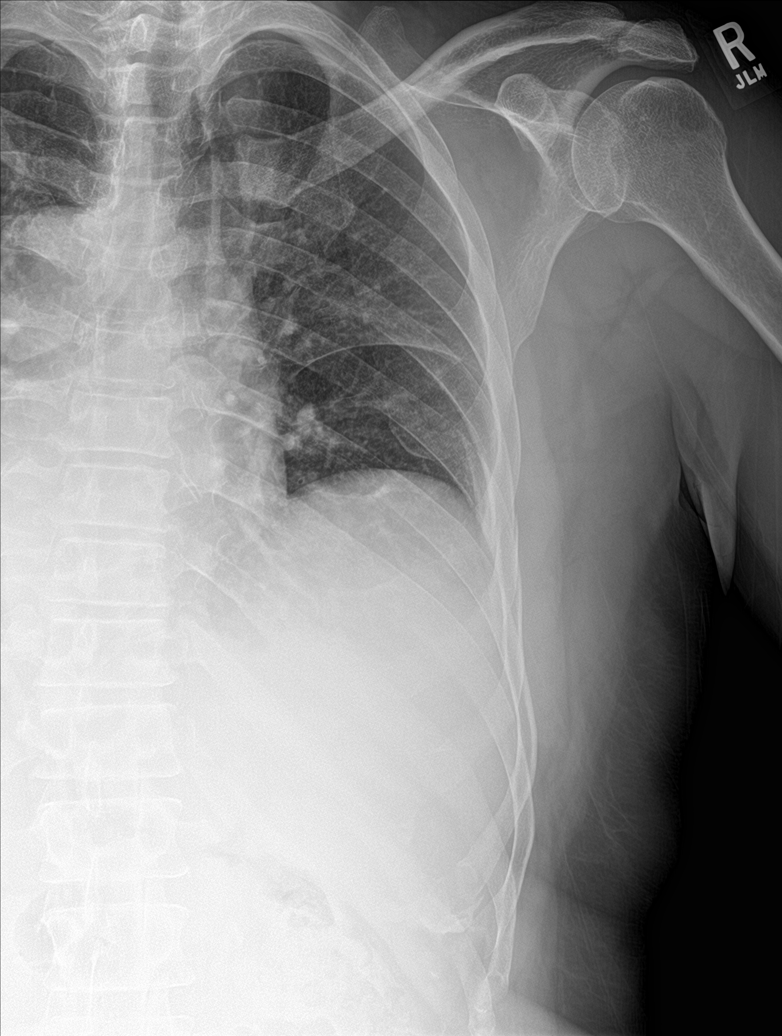

[rib obl (2 of 3)]
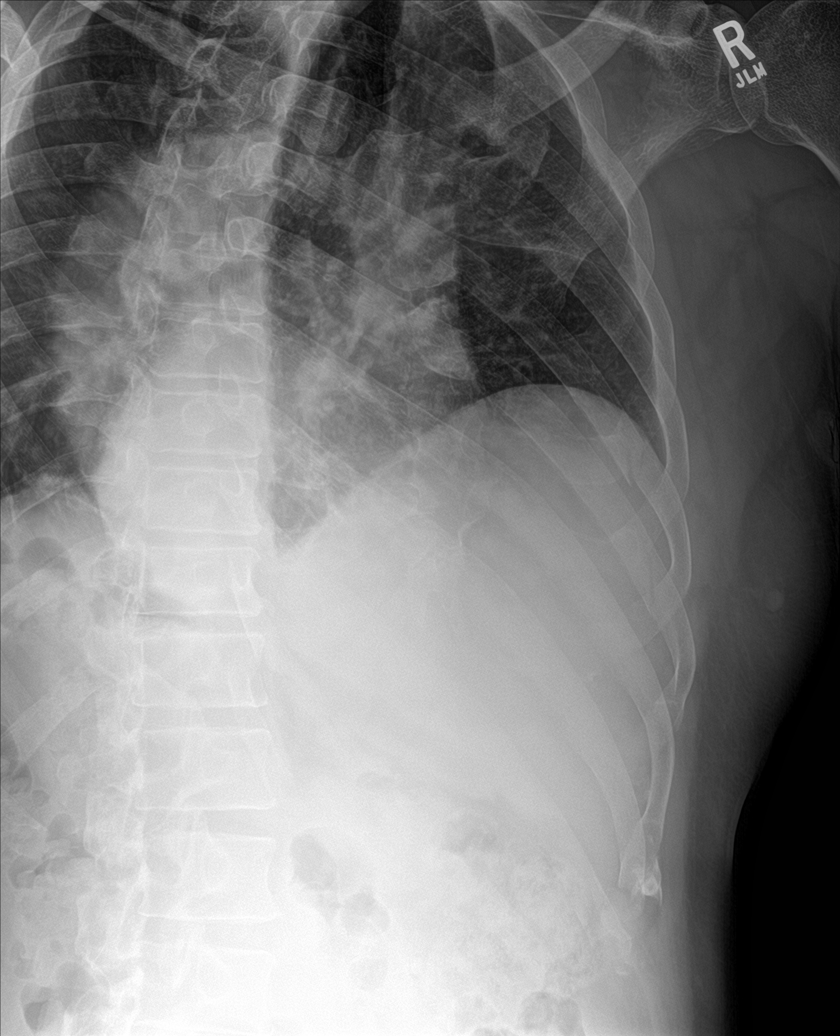

[rib obl (3 of 3)]
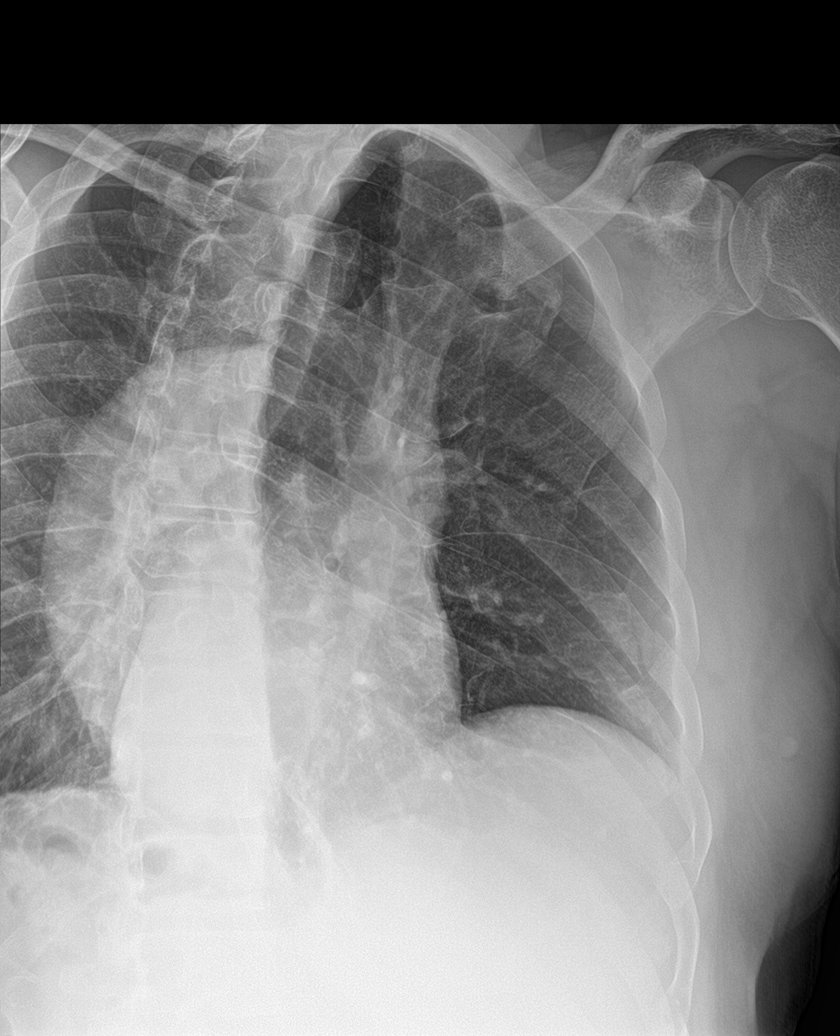

[5 of 5 positions shown; findings below may reference images not displayed]

FINDINGS: Acute minimally displaced fracture of the lateral right sixth rib.
Acute nondisplaced fracture of the lateral right seventh rib. Right
lung field is clear. No pneumothorax. Heart size is normal. Minimal
left basilar atelectasis.
IMPRESSION: 1. Acute minimally displaced fracture of the lateral right sixth
rib.
2. Acute nondisplaced fracture of the lateral right seventh rib.
3. No pneumothorax.

## 2024-01-18 ENCOUNTER — Ambulatory Visit: Admitting: Family Medicine

## 2024-01-18 ENCOUNTER — Encounter: Payer: Self-pay | Admitting: Family Medicine

## 2024-01-18 ENCOUNTER — Other Ambulatory Visit: Payer: Self-pay

## 2024-01-18 ENCOUNTER — Ambulatory Visit

## 2024-01-18 VITALS — BP 180/100 | HR 66 | Ht 74.0 in | Wt 240.0 lb

## 2024-01-18 DIAGNOSIS — M25561 Pain in right knee: Secondary | ICD-10-CM

## 2024-01-18 DIAGNOSIS — G8929 Other chronic pain: Secondary | ICD-10-CM
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
## 2024-01-18 NOTE — Patient Instructions (Signed)
Thank you for coming in today.   Please get an Xray today before you leave   Call or go to the ER if you develop a large red swollen joint with extreme pain or oozing puss.    Recheck as needed.  

## 2024-01-21 ENCOUNTER — Ambulatory Visit: Payer: Self-pay | Admitting: Family Medicine

## 2024-01-21 NOTE — Progress Notes (Signed)
 Right knee x-ray shows arthritis

## 2024-02-07 ENCOUNTER — Inpatient Hospital Stay (HOSPITAL_COMMUNITY)
Admission: EM | Admit: 2024-02-07 | Discharge: 2024-02-14 | DRG: 065 | Disposition: A | Discharge status: Rehabilitation Facility | Attending: Internal Medicine | Admitting: Internal Medicine

## 2024-02-07 ENCOUNTER — Encounter (HOSPITAL_COMMUNITY): Payer: Self-pay | Admitting: Family Medicine

## 2024-02-07 ENCOUNTER — Other Ambulatory Visit: Payer: Self-pay

## 2024-02-07 ENCOUNTER — Emergency Department (HOSPITAL_COMMUNITY)

## 2024-02-07 DIAGNOSIS — R2681 Unsteadiness on feet: Secondary | ICD-10-CM | POA: Diagnosis present

## 2024-02-07 DIAGNOSIS — K5900 Constipation, unspecified: Secondary | ICD-10-CM | POA: Diagnosis present

## 2024-02-07 DIAGNOSIS — N179 Acute kidney failure, unspecified: Secondary | ICD-10-CM | POA: Diagnosis not present

## 2024-02-07 DIAGNOSIS — I69392 Facial weakness following cerebral infarction: Secondary | ICD-10-CM | POA: Diagnosis not present

## 2024-02-07 DIAGNOSIS — Z7151 Drug abuse counseling and surveillance of drug abuser: Secondary | ICD-10-CM | POA: Diagnosis not present

## 2024-02-07 DIAGNOSIS — I130 Hypertensive heart and chronic kidney disease with heart failure and stage 1 through stage 4 chronic kidney disease, or unspecified chronic kidney disease: Secondary | ICD-10-CM | POA: Diagnosis present

## 2024-02-07 DIAGNOSIS — M25561 Pain in right knee: Secondary | ICD-10-CM | POA: Diagnosis present

## 2024-02-07 DIAGNOSIS — Z6832 Body mass index (BMI) 32.0-32.9, adult: Secondary | ICD-10-CM

## 2024-02-07 DIAGNOSIS — G8311 Monoplegia of lower limb affecting right dominant side: Secondary | ICD-10-CM | POA: Diagnosis present

## 2024-02-07 DIAGNOSIS — Z791 Long term (current) use of non-steroidal anti-inflammatories (NSAID): Secondary | ICD-10-CM | POA: Diagnosis not present

## 2024-02-07 DIAGNOSIS — I63441 Cerebral infarction due to embolism of right cerebellar artery: Secondary | ICD-10-CM | POA: Diagnosis present

## 2024-02-07 DIAGNOSIS — Z880 Allergy status to penicillin: Secondary | ICD-10-CM

## 2024-02-07 DIAGNOSIS — I6501 Occlusion and stenosis of right vertebral artery: Secondary | ICD-10-CM | POA: Diagnosis present

## 2024-02-07 DIAGNOSIS — I472 Ventricular tachycardia, unspecified: Secondary | ICD-10-CM | POA: Diagnosis not present

## 2024-02-07 DIAGNOSIS — Z7901 Long term (current) use of anticoagulants: Secondary | ICD-10-CM | POA: Diagnosis not present

## 2024-02-07 DIAGNOSIS — G8929 Other chronic pain: Secondary | ICD-10-CM | POA: Diagnosis present

## 2024-02-07 DIAGNOSIS — I129 Hypertensive chronic kidney disease with stage 1 through stage 4 chronic kidney disease, or unspecified chronic kidney disease: Secondary | ICD-10-CM | POA: Diagnosis not present

## 2024-02-07 DIAGNOSIS — I6389 Other cerebral infarction: Secondary | ICD-10-CM | POA: Diagnosis not present

## 2024-02-07 DIAGNOSIS — Z7952 Long term (current) use of systemic steroids: Secondary | ICD-10-CM | POA: Diagnosis not present

## 2024-02-07 DIAGNOSIS — Z23 Encounter for immunization: Secondary | ICD-10-CM | POA: Diagnosis not present

## 2024-02-07 DIAGNOSIS — E66811 Obesity, class 1: Secondary | ICD-10-CM | POA: Diagnosis present

## 2024-02-07 DIAGNOSIS — Z602 Problems related to living alone: Secondary | ICD-10-CM | POA: Diagnosis present

## 2024-02-07 DIAGNOSIS — I5032 Chronic diastolic (congestive) heart failure: Secondary | ICD-10-CM | POA: Diagnosis present

## 2024-02-07 DIAGNOSIS — I69322 Dysarthria following cerebral infarction: Secondary | ICD-10-CM | POA: Diagnosis not present

## 2024-02-07 DIAGNOSIS — Z716 Tobacco abuse counseling: Secondary | ICD-10-CM | POA: Diagnosis not present

## 2024-02-07 DIAGNOSIS — N1831 Chronic kidney disease, stage 3a: Secondary | ICD-10-CM | POA: Diagnosis present

## 2024-02-07 DIAGNOSIS — F17210 Nicotine dependence, cigarettes, uncomplicated: Secondary | ICD-10-CM | POA: Diagnosis present

## 2024-02-07 DIAGNOSIS — I10 Essential (primary) hypertension: Secondary | ICD-10-CM | POA: Diagnosis not present

## 2024-02-07 DIAGNOSIS — E785 Hyperlipidemia, unspecified: Secondary | ICD-10-CM | POA: Diagnosis present

## 2024-02-07 DIAGNOSIS — F1210 Cannabis abuse, uncomplicated: Secondary | ICD-10-CM | POA: Diagnosis present

## 2024-02-07 DIAGNOSIS — Z9181 History of falling: Secondary | ICD-10-CM | POA: Diagnosis not present

## 2024-02-07 DIAGNOSIS — I639 Cerebral infarction, unspecified: Principal | ICD-10-CM | POA: Diagnosis present

## 2024-02-07 DIAGNOSIS — F1910 Other psychoactive substance abuse, uncomplicated: Secondary | ICD-10-CM | POA: Diagnosis not present

## 2024-02-07 DIAGNOSIS — E1122 Type 2 diabetes mellitus with diabetic chronic kidney disease: Secondary | ICD-10-CM | POA: Diagnosis not present

## 2024-02-07 DIAGNOSIS — Z79899 Other long term (current) drug therapy: Secondary | ICD-10-CM | POA: Diagnosis not present

## 2024-02-07 DIAGNOSIS — F1410 Cocaine abuse, uncomplicated: Secondary | ICD-10-CM | POA: Diagnosis present

## 2024-02-07 DIAGNOSIS — R471 Dysarthria and anarthria: Secondary | ICD-10-CM | POA: Diagnosis present

## 2024-02-07 DIAGNOSIS — I63011 Cerebral infarction due to thrombosis of right vertebral artery: Secondary | ICD-10-CM | POA: Diagnosis not present

## 2024-02-07 DIAGNOSIS — I4891 Unspecified atrial fibrillation: Secondary | ICD-10-CM | POA: Diagnosis present

## 2024-02-07 DIAGNOSIS — I69351 Hemiplegia and hemiparesis following cerebral infarction affecting right dominant side: Secondary | ICD-10-CM | POA: Diagnosis not present

## 2024-02-07 LAB — DIFFERENTIAL
Abs Immature Granulocytes: 0.03 K/uL (ref 0.00–0.07)
Basophils Absolute: 0 K/uL (ref 0.0–0.1)
Basophils Relative: 1 %
Eosinophils Absolute: 0.2 K/uL (ref 0.0–0.5)
Eosinophils Relative: 4 %
Immature Granulocytes: 1 %
Lymphocytes Relative: 35 %
Lymphs Abs: 1.9 K/uL (ref 0.7–4.0)
Monocytes Absolute: 0.5 K/uL (ref 0.1–1.0)
Monocytes Relative: 9 %
Neutro Abs: 2.9 K/uL (ref 1.7–7.7)
Neutrophils Relative %: 50 %

## 2024-02-07 LAB — CBC
HCT: 41.3 % (ref 39.0–52.0)
Hemoglobin: 12.7 g/dL — ABNORMAL LOW (ref 13.0–17.0)
MCH: 26.5 pg (ref 26.0–34.0)
MCHC: 30.8 g/dL (ref 30.0–36.0)
MCV: 86.2 fL (ref 80.0–100.0)
Platelets: 253 K/uL (ref 150–400)
RBC: 4.79 MIL/uL (ref 4.22–5.81)
RDW: 14.9 % (ref 11.5–15.5)
WBC: 5.6 K/uL (ref 4.0–10.5)
nRBC: 0 % (ref 0.0–0.2)

## 2024-02-07 LAB — COMPREHENSIVE METABOLIC PANEL WITH GFR
ALT: 14 U/L (ref 0–44)
AST: 20 U/L (ref 15–41)
Albumin: 3.3 g/dL — ABNORMAL LOW (ref 3.5–5.0)
Alkaline Phosphatase: 67 U/L (ref 38–126)
Anion gap: 8 (ref 5–15)
BUN: 14 mg/dL (ref 8–23)
CO2: 23 mmol/L (ref 22–32)
Calcium: 8.7 mg/dL — ABNORMAL LOW (ref 8.9–10.3)
Chloride: 106 mmol/L (ref 98–111)
Creatinine, Ser: 1.34 mg/dL — ABNORMAL HIGH (ref 0.61–1.24)
GFR, Estimated: 58 mL/min — ABNORMAL LOW (ref >60)
Glucose, Bld: 95 mg/dL (ref 70–99)
Potassium: 4.5 mmol/L (ref 3.5–5.1)
Sodium: 137 mmol/L (ref 135–145)
Total Bilirubin: 0.8 mg/dL (ref 0.0–1.2)
Total Protein: 6.9 g/dL (ref 6.5–8.1)

## 2024-02-07 LAB — I-STAT CHEM 8, ED
BUN: 16 mg/dL (ref 8–23)
Calcium, Ion: 1.17 mmol/L (ref 1.15–1.40)
Chloride: 105 mmol/L (ref 98–111)
Creatinine, Ser: 1.5 mg/dL — ABNORMAL HIGH (ref 0.61–1.24)
Glucose, Bld: 95 mg/dL (ref 70–99)
HCT: 42 % (ref 39.0–52.0)
Hemoglobin: 14.3 g/dL (ref 13.0–17.0)
Potassium: 4.4 mmol/L (ref 3.5–5.1)
Sodium: 141 mmol/L (ref 135–145)
TCO2: 25 mmol/L (ref 22–32)

## 2024-02-07 LAB — APTT: aPTT: 26 s (ref 24–36)

## 2024-02-07 LAB — ETHANOL: Alcohol, Ethyl (B): <15 mg/dL (ref <15)

## 2024-02-07 LAB — PROTIME-INR
INR: 0.9 (ref 0.8–1.2)
Prothrombin Time: 12.7 s (ref 11.4–15.2)

## 2024-02-07 MED ORDER — HEPARIN SODIUM (PORCINE) 5000 UNIT/ML IJ SOLN: 60.0000 [IU]/kg | Freq: Once | INTRAMUSCULAR | Status: DC

## 2024-02-07 MED ORDER — ACETAMINOPHEN 650 MG RE SUPP: 650.0000 mg | RECTAL | Status: DC | PRN

## 2024-02-07 MED ORDER — STROKE: EARLY STAGES OF RECOVERY BOOK
Freq: Once | Status: AC
  Filled 2024-02-07: qty 1

## 2024-02-07 MED ORDER — ACETAMINOPHEN 325 MG PO TABS: 650.0000 mg | ORAL_TABLET | ORAL | Status: DC | PRN

## 2024-02-07 MED ORDER — HEPARIN (PORCINE) 25000 UT/250ML-% IV SOLN
1300.0000 [IU]/h | INTRAVENOUS | Status: DC
  Administered 2024-02-07 – 2024-02-10 (×4): 1300 [IU]/h via INTRAVENOUS
  Filled 2024-02-07 (×4): qty 250

## 2024-02-07 MED ORDER — SENNOSIDES-DOCUSATE SODIUM 8.6-50 MG PO TABS: 1.0000 | ORAL_TABLET | Freq: Every evening | ORAL | Status: DC | PRN

## 2024-02-07 MED ORDER — ACETAMINOPHEN 160 MG/5ML PO SOLN: 650.0000 mg | ORAL | Status: DC | PRN

## 2024-02-07 MED ORDER — SODIUM CHLORIDE 0.9 % IV SOLN: INTRAVENOUS | Status: AC

## 2024-02-07 MED ORDER — IOHEXOL 350 MG/ML SOLN
75.0000 mL | Freq: Once | INTRAVENOUS | Status: AC | PRN
  Administered 2024-02-07: 75 mL via INTRAVENOUS

## 2024-02-07 MED ORDER — ONDANSETRON HCL 4 MG/2ML IJ SOLN: 4.0000 mg | Freq: Four times a day (QID) | INTRAMUSCULAR | Status: DC | PRN

## 2024-02-07 MED ORDER — MECLIZINE HCL 25 MG PO TABS: 25.0000 mg | ORAL_TABLET | Freq: Three times a day (TID) | ORAL | Status: DC | PRN
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
## 2024-02-07 NOTE — ED Notes (Signed)
 Patient provided with snack and beverage. Denies other need

## 2024-02-07 NOTE — ED Notes (Signed)
Shift report received

## 2024-02-07 NOTE — ED Triage Notes (Signed)
 Pt  bib [facility] from home. Pt experiencing right side facial numbness and leg numbness beginning yesterday at noon after hanging christmas lights outside. Progressively getting worse.

## 2024-02-07 NOTE — ED Notes (Signed)
 Pt given urinal.  KM
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
## 2024-02-07 NOTE — ED Provider Notes (Signed)
 [place] EMERGENCY DEPARTMENT AT [facility] Provider Note   CSN: [id number] Arrival date & time: 02/07/24  1755     Patient presents with: Numbness and Weakness   Jonathon Harper is a 68 y.o. male.  History of hypertension, HFpEF presenting with strokelike symptoms since yesterday.  Last known well 12 PM yesterday.  History per patient.  Endorses he was putting up Christmas lights, when he was getting down and felt sudden onset numbness and tingling to the right side of his face, and then he started feeling weakness and right upper and lower extremity as well as slurred speech.  Endorses a mild headache.  Patient also notes trouble ambulating.  He states normally he is able to walk without difficulty with a cane, however after developing the symptoms, he feels very unsteady on his feet, and is constantly grabbing the wall to be able to provide extra support.  Denies nausea or vomiting.  Endorses headache is primarily at the top, and extending posteriorly.  Reports that weakness has significantly improved, however he still is having significant difficulty with walking.  Patient is concerned that he may have had a stroke.  BG 148.  We are greater than 24 hours outside of symptom onset.  {Add pertinent medical, surgical, social history, OB history to HPI:32947}  Weakness      Prior to Admission medications   Medication Sig Start Date End Date Taking? Authorizing Provider  amLODipine  (NORVASC ) 5 MG tablet Take 1 tablet (5 mg total) by mouth daily. 12/11/13   Fairy Frames, MD  ibuprofen  (ADVIL ) 800 MG tablet Take 1 tablet (800 mg total) by mouth 3 (three) times daily. 01/06/21   White, Shelba SAUNDERS, NP  oseltamivir  (TAMIFLU ) 75 MG capsule Take 1 capsule (75 mg total) by mouth every 12 (twelve) hours. 04/21/22   Jerral Meth, MD  oxyCODONE -acetaminophen  (PERCOCET/ROXICET) 5-325 MG tablet Take 1 tablet by mouth every 8 (eight) hours as needed for severe pain. Patient not taking:  Reported on 01/18/2024 11/30/22   Persons, Ronal Dragon, [place]  predniSONE  (DELTASONE ) 20 MG tablet Take 2 tablets (40 mg total) by mouth daily. Patient not taking: Reported on 01/18/2024 06/09/22   Teresa Shelba SAUNDERS, NP  tiZANidine  (ZANAFLEX ) 2 MG tablet Take 1 tablet (2 mg total) by mouth 2 (two) times daily as needed for muscle spasms. 01/06/21   Teresa Shelba SAUNDERS, NP  triamcinolone  cream (KENALOG ) 0.1 % Apply 1 application topically 2 (two) times daily. 07/11/18   Adah Wilbert LABOR, FNP    Allergies: Penicillins    Review of Systems  Neurological:  Positive for weakness.    Updated Vital Signs There were no vitals taken for this visit.  Physical Exam Vitals and nursing note reviewed.  Constitutional:      General: He is in acute distress.     Appearance: He is well-developed. He is not ill-appearing.  HENT:     Head: Normocephalic and atraumatic.     Mouth/Throat:     Mouth: Mucous membranes are moist.     Pharynx: Oropharynx is clear.  Eyes:     Conjunctiva/sclera: Conjunctivae normal.  Cardiovascular:     Rate and Rhythm: Normal rate and regular rhythm.     Heart sounds: No murmur heard. Pulmonary:     Effort: Pulmonary effort is normal. No respiratory distress.     Breath sounds: Normal breath sounds.  Abdominal:     General: Abdomen is flat. There is no distension.     Palpations: Abdomen is  soft.     Tenderness: There is no abdominal tenderness. There is no right CVA tenderness, left CVA tenderness, guarding or rebound.  Musculoskeletal:        General: No swelling.     Cervical back: Neck supple.  Skin:    General: Skin is warm and dry.     Capillary Refill: Capillary refill takes less than 2 seconds.  Neurological:     Mental Status: He is alert.     Comments: 4/5 strength right upper and lower extremity in comparison to the left, 5/5 strength.  Reduced coordination of the right lower extremity in comparison to the left, equal coordination bilateral upper extremities.   Slurred speech, no facial droop appreciated.   No nystagmus appreciated.  Reduced sensation over the right side of the face.  Mildly reduced sensation over the right upper and lower extremity in comparison to the left.  Positive Romberg sign, significant difficulty when trying to ambulate, requiring significant support.  Psychiatric:        Mood and Affect: Mood normal.     (all labs ordered are listed, but only abnormal results are displayed) Labs Reviewed - No data to display  EKG: None  Radiology: No results found.  {Document cardiac monitor, telemetry assessment procedure when appropriate:32947} Procedures   Medications Ordered in the ED - No data to display    {Click here for ABCD2, HEART and other calculators REFRESH Note before signing:1}                              Medical Decision Making Amount and/or Complexity of Data Reviewed Labs: ordered. Radiology: ordered.   ***  {Document critical care time when appropriate  Document review of labs and clinical decision tools ie CHADS2VASC2, etc  Document your independent review of radiology images and any outside records  Document your discussion with family members, caretakers and with consultants  Document social determinants of health affecting pt's care  Document your decision making why or why not admission, treatments were needed:32947:::1}   Final diagnoses:  None    ED Discharge Orders     None

## 2024-02-07 NOTE — ED Provider Notes (Signed)
 I saw and evaluated the patient, reviewed the resident's note and I agree with the findings and plan.   68 year old male presents with right sided lower extremity weakness as well as slurred speech which began over 24 hours ago.  No headaches.  Patient notes trouble ambulating.  Called EMS.  Blood sugar was over 100.  He is out of the window for intervention.  Suspect stroke and workup is pending at this time   Dasie Faden, MD 02/07/24 1807
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
## 2024-02-07 NOTE — ED Notes (Signed)
 CCMD notified

## 2024-02-08 ENCOUNTER — Telehealth (HOSPITAL_COMMUNITY): Payer: Self-pay | Admitting: Pharmacy Technician

## 2024-02-08 ENCOUNTER — Inpatient Hospital Stay (HOSPITAL_COMMUNITY)

## 2024-02-08 ENCOUNTER — Other Ambulatory Visit (HOSPITAL_COMMUNITY): Payer: Self-pay

## 2024-02-08 ENCOUNTER — Other Ambulatory Visit: Payer: Self-pay | Admitting: Cardiology

## 2024-02-08 DIAGNOSIS — I639 Cerebral infarction, unspecified: Secondary | ICD-10-CM

## 2024-02-08 LAB — BASIC METABOLIC PANEL WITH GFR
Anion gap: 12 (ref 5–15)
BUN: 12 mg/dL (ref 8–23)
CO2: 21 mmol/L — ABNORMAL LOW (ref 22–32)
Calcium: 8.7 mg/dL — ABNORMAL LOW (ref 8.9–10.3)
Chloride: 105 mmol/L (ref 98–111)
Creatinine, Ser: 1.32 mg/dL — ABNORMAL HIGH (ref 0.61–1.24)
GFR, Estimated: 59 mL/min — ABNORMAL LOW (ref >60)
Glucose, Bld: 105 mg/dL — ABNORMAL HIGH (ref 70–99)
Potassium: 4 mmol/L (ref 3.5–5.1)
Sodium: 138 mmol/L (ref 135–145)

## 2024-02-08 LAB — LIPID PANEL
Cholesterol: 164 mg/dL (ref 0–200)
HDL: 51 mg/dL (ref >40)
LDL Cholesterol: 100 mg/dL — ABNORMAL HIGH (ref 0–99)
Total CHOL/HDL Ratio: 3.2 ratio
Triglycerides: 65 mg/dL (ref <150)
VLDL: 13 mg/dL (ref 0–40)

## 2024-02-08 LAB — ECHOCARDIOGRAM COMPLETE
AR max vel: 1.78 cm2
AV Area VTI: 1.85 cm2
AV Area mean vel: 1.62 cm2
AV Mean grad: 6 mmHg
AV Peak grad: 12.4 mmHg
Ao pk vel: 1.76 m/s
Area-P 1/2: 2.93 cm2
Calc EF: 61 %
Height: 74 in
MV VTI: 2.86 cm2
S' Lateral: 4.2 cm
Single Plane A2C EF: 50.7 %
Single Plane A4C EF: 68 %
Weight: 4064 [oz_av]

## 2024-02-08 LAB — RAPID URINE DRUG SCREEN, HOSP PERFORMED
Amphetamines: NOT DETECTED
Barbiturates: NOT DETECTED
Benzodiazepines: NOT DETECTED
Cocaine: POSITIVE — AB
Opiates: NOT DETECTED
Tetrahydrocannabinol: POSITIVE — AB

## 2024-02-08 LAB — HEMOGLOBIN A1C
Hgb A1c MFr Bld: 6 % — ABNORMAL HIGH (ref 4.8–5.6)
Mean Plasma Glucose: 125.5 mg/dL

## 2024-02-08 LAB — CBC
HCT: 38.5 % — ABNORMAL LOW (ref 39.0–52.0)
Hemoglobin: 12.1 g/dL — ABNORMAL LOW (ref 13.0–17.0)
MCH: 26.8 pg (ref 26.0–34.0)
MCHC: 31.4 g/dL (ref 30.0–36.0)
MCV: 85.4 fL (ref 80.0–100.0)
Platelets: 240 K/uL (ref 150–400)
RBC: 4.51 MIL/uL (ref 4.22–5.81)
RDW: 15.1 % (ref 11.5–15.5)
WBC: 6 K/uL (ref 4.0–10.5)
nRBC: 0 % (ref 0.0–0.2)

## 2024-02-08 LAB — HEPARIN LEVEL (UNFRACTIONATED)
Heparin Unfractionated: 0.34 [IU]/mL (ref 0.30–0.70)
Heparin Unfractionated: 0.41 [IU]/mL (ref 0.30–0.70)

## 2024-02-08 LAB — HIV ANTIBODY (ROUTINE TESTING W REFLEX): HIV Screen 4th Generation wRfx: NONREACTIVE

## 2024-02-08 MED ORDER — CYCLOBENZAPRINE HCL 5 MG PO TABS
10.0000 mg | ORAL_TABLET | Freq: Three times a day (TID) | ORAL | Status: DC | PRN
  Administered 2024-02-08: 10 mg via ORAL
  Filled 2024-02-08: qty 2

## 2024-02-08 MED ORDER — ATORVASTATIN CALCIUM 40 MG PO TABS
40.0000 mg | ORAL_TABLET | Freq: Every day | ORAL | Status: DC
  Administered 2024-02-08 – 2024-02-14 (×7): 40 mg via ORAL
  Filled 2024-02-08 (×7): qty 1

## 2024-02-08 MED ORDER — AMLODIPINE BESYLATE 5 MG PO TABS
5.0000 mg | ORAL_TABLET | Freq: Every evening | ORAL | Status: DC
  Administered 2024-02-08 – 2024-02-13 (×4): 5 mg via ORAL
  Filled 2024-02-08 (×6): qty 1
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
## 2024-02-08 NOTE — Hospital Course (Addendum)
 Jonathon Harper is a 68 y.o. male with PMH of 68 y.o. male with medical history significant for hypertension and CKD 3A who presents with gait instability, right-sided numbness and weakness, and slurred speech. ED Course: Upon arrival to the ED, patient is found to be afebrile and saturating well on room air with normal RR, normal HR, and stable BP.  Labs are most notable for creatinine 1.34, normal WBC, and undetectable ethanol.  CT is concerning for new right cerebellar hypodensity consistent with acute infarct and CTA findings raise concern for right vertebral artery thrombus. Neurology was consulted by the ED physician, discussed the case with neuro IR, and the patient was started on IV heparin .  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 Currently out of permissive hypertension window.  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  Mobility: PT Orders: Active PT Follow up Rec: Acute Inpatient Rehab (3hours/Day)02/08/2024 0845   DVT prophylaxis:  Code Status:   Code Status: Full Code Family Communication: plan of care discussed with patient at bedside. Patient status is: Remains hospitalized because of severity of illness Level of care: Progressive   Dispo: The patient is from: home            Anticipated disposition: TBD Objective: Vitals last 24 hrs: Vitals:   02/08/24 0415 02/08/24 0430 02/08/24 0445 02/08/24 0500  BP: (!) 149/91 (!) 159/91 (!) 167/103 (!) 157/95  Pulse: (!) 55 (!) 49 (!) 50 (!) 48  Resp: 13 14 14 13   Temp:      TempSrc:      SpO2: 100% 100% 100% 100%  Weight:      Height:        Physical Examination: General exam: alert awake, oriented, older than stated age HEENT:Oral mucosa moist, Ear/Nose WNL grossly Respiratory system: Bilaterally clear BS,no use of accessory muscle Cardiovascular system: S1 & S2 +, No JVD. Gastrointestinal system: Abdomen soft,NT,ND, BS+ Nervous System: Alert, awake, moving all extremities well unable to get him up to walk, currently echo being done Extremities: extremities warm, leg edema neg Skin: Warm, no rashes MSK: Normal muscle bulk,tone, power   Medications reviewed:  Scheduled Meds:   stroke: early stages of recovery book   Does not apply Once  amLODipine   5 mg Oral QPM   atorvastatin   40 mg Oral Daily   Continuous Infusions:  sodium chloride  40 mL/hr at 02/08/24 0507   heparin  1,300 Units/hr (02/08/24 0507)   Diet: Diet Order             Diet regular Fluid consistency: Thin  Diet effective now

## 2024-02-08 NOTE — Progress Notes (Signed)
 Ordering 30 day monitor, post-stroke. Results to Dr. Kate

## 2024-02-08 NOTE — Progress Notes (Signed)
  Echocardiogram 2D Echocardiogram has been performed.  Norleen ORN [facility] 02/08/2024, 9:28 AM
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
## 2024-02-08 NOTE — Progress Notes (Signed)
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
## 2024-02-08 NOTE — Telephone Encounter (Signed)
 Patient Product/process development scientist completed.    The patient is insured through [facility]. Patient has Medicare and is not eligible for a copay card, but may be able to apply for patient assistance or Medicare RX Payment Plan (Patient Must reach out to their plan, if eligible for payment plan), if available.    Ran test claim for Eliquis 5 mg and the current 30 day co-pay is $0.00.  Ran test claim for Xarelto  20 mg and the current 30 day co-pay is $0.00.  This test claim was processed through [facility]- copay amounts may vary at other pharmacies due to pharmacy/plan contracts, or as the patient moves through the different stages of their insurance plan.     Jonathon Harper, CPHT Pharmacy Technician Patient Advocate Specialist Lead [facility] Patient Advocate Team Direct Number: [phone]  Fax: [phone]
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
## 2024-02-08 NOTE — Plan of Care (Signed)
  Problem: Coping: Goal: Will verbalize positive feelings about self Outcome: Progressing   Problem: Self-Care: Goal: Ability to participate in self-care as condition permits will improve Outcome: Progressing Goal: Ability to communicate needs accurately will improve Outcome: Progressing
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
## 2024-02-08 NOTE — Progress Notes (Signed)
 PROGRESS NOTE Jonathon Harper  FMW:[id number] DOB: November 25, 1955 DOA: 02/07/2024 PCP: Campbell Reynolds, NP  Brief Narrative/Hospital Course:  Jonathon Harper is a 68 y.o. male with PMH of 68 y.o. male with medical history significant for hypertension and CKD 3A who presents with gait instability, right-sided numbness and weakness, and slurred speech. ED Course: Upon arrival to the ED, patient is found to be afebrile and saturating well on room air with normal RR, normal HR, and stable BP.  Labs are most notable for creatinine 1.34, normal WBC, and undetectable ethanol.  CT is concerning for new right cerebellar hypodensity consistent with acute infarct and CTA findings raise concern for right vertebral artery thrombus. Neurology was consulted by the ED physician, discussed the case with neuro IR, and the patient was started on IV heparin .  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 Currently out of permissive hypertension window.  Starting atorvastatin , continue respiratory modification telemonitoring frequent neurochecks  Hypertension  Resume home amlodipine  5 mg, out of permissive window per neurology   CKD 3A  Appears close to  baseline  Recent Labs    02/07/24 1825 02/07/24 1829 02/08/24 0412  BUN 14 16 12   CREATININE 1.34* 1.50* 1.32*  CO2 23  --  21*  K 4.5 4.4 4.0   Class I Obesity w/ Body mass index is 32.61 kg/m.: Will benefit with PCP follow-up, weight loss,healthy lifestyle and outpatient sleep eval if not done.  Mobility: PT Orders: Active PT Follow up Rec: Acute Inpatient Rehab (3hours/Day)02/08/2024 0845   DVT prophylaxis:  Code Status:   Code Status: Full Code Family Communication: plan of care discussed with patient at bedside. Patient status is: Remains hospitalized because of severity of illness Level of care: Progressive   Dispo: The patient is from: home            Anticipated disposition: TBD Objective: Vitals last 24 hrs: Vitals:   02/08/24 0415 02/08/24 0430 02/08/24 0445 02/08/24 0500  BP: (!) 149/91 (!) 159/91 (!) 167/103 (!) 157/95  Pulse: (!) 55 (!) 49 (!) 50 (!) 48  Resp: 13 14 14 13   Temp:      TempSrc:      SpO2: 100% 100% 100% 100%  Weight:      Height:        Physical Examination: General exam: alert awake, oriented, older than stated age HEENT:Oral mucosa moist, Ear/Nose WNL grossly Respiratory system: Bilaterally clear BS,no use of accessory muscle Cardiovascular system: S1 & S2 +, No JVD. Gastrointestinal system: Abdomen soft,NT,ND, BS+ Nervous System: Alert, awake, moving all extremities well unable to get him up to walk, currently echo being done Extremities: extremities warm, leg edema neg Skin: Warm, no rashes MSK: Normal muscle bulk,tone, power   Medications  reviewed:  Scheduled Meds:   stroke: early stages of recovery book   Does not apply Once   amLODipine   5 mg Oral QPM   atorvastatin   40 mg Oral Daily   Continuous Infusions:  sodium chloride  40 mL/hr at 02/08/24 0507   heparin  1,300 Units/hr (02/08/24 0507)   Diet: Diet Order             Diet regular Fluid consistency: Thin  Diet effective now                    Data Reviewed:  I have personally reviewed following labs and imaging studies ( see epic result tab) CBC: Recent Labs  Lab 02/07/24 1825 02/07/24 1829 02/08/24 0412  WBC 5.6  --  6.0  NEUTROABS 2.9  --   --   HGB 12.7* 14.3 12.1*  HCT 41.3 42.0 38.5*  MCV 86.2  --  85.4  PLT 253  --  240   CMP: Recent Labs  Lab 02/07/24 1825 02/07/24 1829 02/08/24 0412  NA 137 141 138  K 4.5 4.4 4.0  CL 106 105 105  CO2 23  --  21*  GLUCOSE 95 95 105*  BUN 14 16 12   CREATININE 1.34* 1.50* 1.32*  CALCIUM  8.7*  --  8.7*   GFR: Estimated Creatinine Clearance: 72.3 mL/min (A) (by C-G formula based on SCr of 1.32 mg/dL (H)). Recent Labs  Lab 02/07/24 1825  AST 20  ALT 14  ALKPHOS 67  BILITOT 0.8  PROT 6.9  ALBUMIN 3.3*   No results for input(s): LIPASE, AMYLASE in the last 168 hours. No results for input(s): AMMONIA in the last 168 hours. Coagulation Profile:  Recent Labs  Lab 02/07/24 1825  INR 0.9   Unresulted Labs (From admission, onward)     Start     Ordered   02/09/24 0500  Heparin  level (unfractionated)  Daily,   R      02/07/24 1956   02/08/24 1200  Heparin  level (unfractionated)  Once-Timed,   TIMED        02/08/24 0453   02/08/24 0830  Urine rapid drug screen (hosp performed)  (Code Stroke (Thrombolytic / IR candidate emergent stroke workup.))  ONCE - STAT,   STAT       Question:  Indication  Answer:  Altered mental status, unspecified R41.82   02/08/24 0829   02/08/24 0500  CBC  (Labs)  Daily,   R      02/07/24 2206   02/08/24 0500  Basic metabolic panel  Daily,   R      02/07/24 2206           Antimicrobials/Microbiology: Anti-infectives (From admission, onward)    None         Component Value Date/Time   SDES  04/21/2022 1652    BLOOD RIGHT HAND Performed at [facility], [address], [place], [place] [address]    SPECREQUEST  04/21/2022 1652    BOTTLES DRAWN AEROBIC AND ANAEROBIC Blood Culture adequate volume Performed at [facility], [address], [place], [place] [address]    CULT  04/21/2022 1652    NO GROWTH 5 DAYS Performed at [facility], [address], [place], [place] [address]    REPTSTATUS 04/26/2022 FINAL 04/21/2022 1652    Procedures:  Mennie LAMY, MD [facility] 02/08/2024, 10:26 AM

## 2024-02-09 ENCOUNTER — Inpatient Hospital Stay (HOSPITAL_COMMUNITY)

## 2024-02-09 DIAGNOSIS — I639 Cerebral infarction, unspecified: Secondary | ICD-10-CM

## 2024-02-09 LAB — BASIC METABOLIC PANEL WITH GFR
Anion gap: 7 (ref 5–15)
BUN: 15 mg/dL (ref 8–23)
CO2: 27 mmol/L (ref 22–32)
Calcium: 8.7 mg/dL — ABNORMAL LOW (ref 8.9–10.3)
Chloride: 104 mmol/L (ref 98–111)
Creatinine, Ser: 1.35 mg/dL — ABNORMAL HIGH (ref 0.61–1.24)
GFR, Estimated: 57 mL/min — ABNORMAL LOW (ref >60)
Glucose, Bld: 98 mg/dL (ref 70–99)
Potassium: 3.8 mmol/L (ref 3.5–5.1)
Sodium: 138 mmol/L (ref 135–145)

## 2024-02-09 LAB — MAGNESIUM: Magnesium: 1.9 mg/dL (ref 1.7–2.4)

## 2024-02-09 LAB — HEPARIN LEVEL (UNFRACTIONATED): Heparin Unfractionated: 0.37 [IU]/mL (ref 0.30–0.70)

## 2024-02-09 MED ORDER — POTASSIUM CHLORIDE CRYS ER 20 MEQ PO TBCR
40.0000 meq | EXTENDED_RELEASE_TABLET | Freq: Once | ORAL | Status: AC
  Administered 2024-02-09: 40 meq via ORAL
  Filled 2024-02-09: qty 2

## 2024-02-09 MED ORDER — CARVEDILOL 3.125 MG PO TABS: 3.1250 mg | ORAL_TABLET | Freq: Two times a day (BID) | ORAL | Status: DC

## 2024-02-09 NOTE — Progress Notes (Signed)
 Bilateral lower extremity venous duplex has been completed. Preliminary results can be found in CV Proc through chart review.   02/09/24 10:38 AM Cathlyn Collet RVT
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
## 2024-02-09 NOTE — Plan of Care (Signed)
  Problem: Coping: Goal: Will verbalize positive feelings about self Outcome: Progressing   Problem: Self-Care: Goal: Ability to participate in self-care as condition permits will improve Outcome: Progressing   

## 2024-02-09 NOTE — Progress Notes (Signed)
 Inpatient Rehab Admissions Coordinator:   I met with Pt. To discuss potential CIR admit. He is interested and states that he can go to his girlfriend's single story home at d/c. I spoke with his girlfriend [name] and she confirms he can d/c with her and that she can provide 24/7 support at d/c.   Leita Kleine, MS, CCC-SLP Rehab Admissions Coordinator  [phone] (celll) [phone] (office)
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
## 2024-02-09 NOTE — Progress Notes (Signed)
 Informed by telemetry patient had 6 runs of vtach.Back to sinus rhythm.MD made aware.
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
## 2024-02-09 NOTE — Plan of Care (Signed)
  Problem: Education: Goal: Knowledge of patient specific risk factors will improve (DELETE if not current risk factor) Outcome: Progressing   Problem: Intracerebral Hemorrhage Tissue Perfusion: Goal: Complications of Intracerebral Hemorrhage will be minimized Outcome: Progressing   Problem: Coping: Goal: Will verbalize positive feelings about self Outcome: Progressing   Problem: Nutrition: Goal: Dietary intake will improve Outcome: Progressing
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
## 2024-02-10 ENCOUNTER — Other Ambulatory Visit (HOSPITAL_COMMUNITY): Payer: Self-pay

## 2024-02-10 DIAGNOSIS — F17210 Nicotine dependence, cigarettes, uncomplicated: Secondary | ICD-10-CM | POA: Diagnosis not present

## 2024-02-10 DIAGNOSIS — N179 Acute kidney failure, unspecified: Secondary | ICD-10-CM

## 2024-02-10 DIAGNOSIS — I63011 Cerebral infarction due to thrombosis of right vertebral artery: Secondary | ICD-10-CM | POA: Diagnosis not present

## 2024-02-10 DIAGNOSIS — F1210 Cannabis abuse, uncomplicated: Secondary | ICD-10-CM | POA: Diagnosis not present

## 2024-02-10 DIAGNOSIS — F1410 Cocaine abuse, uncomplicated: Secondary | ICD-10-CM | POA: Diagnosis not present

## 2024-02-10 LAB — CBC
HCT: 39.6 % (ref 39.0–52.0)
Hemoglobin: 12.7 g/dL — ABNORMAL LOW (ref 13.0–17.0)
MCH: 26.6 pg (ref 26.0–34.0)
MCHC: 32.1 g/dL (ref 30.0–36.0)
MCV: 83 fL (ref 80.0–100.0)
Platelets: 269 K/uL (ref 150–400)
RBC: 4.77 MIL/uL (ref 4.22–5.81)
RDW: 14.6 % (ref 11.5–15.5)
WBC: 7 K/uL (ref 4.0–10.5)
nRBC: 0 % (ref 0.0–0.2)

## 2024-02-10 LAB — MAGNESIUM: Magnesium: 1.9 mg/dL (ref 1.7–2.4)

## 2024-02-10 LAB — BASIC METABOLIC PANEL WITH GFR
Anion gap: 5 (ref 5–15)
BUN: 14 mg/dL (ref 8–23)
CO2: 27 mmol/L (ref 22–32)
Calcium: 9 mg/dL (ref 8.9–10.3)
Chloride: 107 mmol/L (ref 98–111)
Creatinine, Ser: 1.05 mg/dL (ref 0.61–1.24)
GFR, Estimated: >60 mL/min (ref >60)
Glucose, Bld: 102 mg/dL — ABNORMAL HIGH (ref 70–99)
Potassium: 4.5 mmol/L (ref 3.5–5.1)
Sodium: 139 mmol/L (ref 135–145)

## 2024-02-10 LAB — HEPARIN LEVEL (UNFRACTIONATED): Heparin Unfractionated: 0.42 [IU]/mL (ref 0.30–0.70)

## 2024-02-10 MED ORDER — MAGNESIUM SULFATE IN D5W 1-5 GM/100ML-% IV SOLN
1.0000 g | Freq: Once | INTRAVENOUS | Status: AC
  Administered 2024-02-10: 1 g via INTRAVENOUS
  Filled 2024-02-10: qty 100

## 2024-02-10 MED ORDER — APIXABAN 5 MG PO TABS
5.0000 mg | ORAL_TABLET | Freq: Two times a day (BID) | ORAL | Status: DC
  Administered 2024-02-10 – 2024-02-14 (×9): 5 mg via ORAL
  Filled 2024-02-10 (×9): qty 1

## 2024-02-10 MED ORDER — NICOTINE 21 MG/24HR TD PT24
21.0000 mg | MEDICATED_PATCH | Freq: Every day | TRANSDERMAL | Status: DC
  Administered 2024-02-10 – 2024-02-14 (×5): 21 mg via TRANSDERMAL
  Filled 2024-02-10 (×5): qty 1
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
## 2024-02-10 NOTE — TOC Initial Note (Signed)
 Transition of Care [facility]) - Initial/Assessment Note    Patient Details  Name: Jonathon Harper MRN: [id number] Date of Birth: 03-10-1955  Transition of Care [facility]) CM/SW Contact:    Landry DELENA Senters, RN Phone Number: 02/10/2024, 10:06 AM  Clinical Narrative:                 RR:[id number] history significant for hypertension and CKD 3A who presents with gait instability, right-sided numbness and weakness, and slurred speech.   Patient lives alone, reports he does have a girlfriend that stays with him sometimes. She can provide support at home and assist with driving needs, will be taking him home at d/c. Patient does report concern about living on 3rd floor of apt building with no elevator access. He is able to stay with his girlfriend in single story home at d/c.   Patient has driven up to this point, DME reviewed-cane, is on disability, has PCP, manages own medications.   Potential plan for CIR after hospital d/c. Patient is open to this, does report she is able to provide 24/7 support after CIR.   CM will continue to follow.         Patient Goals and CMS Choice            Expected Discharge Plan and Services                                              Prior Living Arrangements/Services                       Activities of Daily Living   ADL Screening (condition at time of admission) Independently performs ADLs?: Yes (appropriate for developmental age) Is the patient deaf or have difficulty hearing?: No Does the patient have difficulty seeing, even when wearing glasses/contacts?: No Does the patient have difficulty concentrating, remembering, or making decisions?: No  Permission Sought/Granted                  Emotional Assessment              Admission diagnosis:  Cerebellar stroke, acute (HCC) [I63.9] Cerebrovascular accident (CVA), unspecified mechanism (HCC) [I63.9] Patient Active Problem List   Diagnosis Date Noted   Cerebellar stroke,  acute (HCC) 02/07/2024   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 02/07/2024   Hand fracture, right 01/19/2023   Metacarpal bone fracture 11/03/2022   NSVT (nonsustained ventricular tachycardia) (HCC) 12/09/2013   Abnormal ECG 12/09/2013   Abnormal echocardiogram 12/09/2013   Syncope 12/08/2013   CKD stage 3a, GFR 45-59 ml/min (HCC) 12/08/2013   HTN (hypertension) 12/08/2013   Tobacco abuse 12/08/2013   PCP:  Campbell Reynolds, NP Pharmacy:   [id number] [address] [place] - [address] [place] [address] Phone: [phone] Fax: [phone]  [facility] [address] [place] - [address] AT [address] [place] [address] Phone: [phone] Fax: [phone]     Social Drivers of Health (SDOH) Social History: SDOH Screenings   Food Insecurity: No Food Insecurity (02/09/2024)  Housing: Low Risk (02/09/2024)  Transportation Needs: No Transportation Needs (02/09/2024)  Utilities: Not At Risk (02/09/2024)  Social Connections: Moderately Isolated (02/09/2024)  Tobacco Use: High Risk (02/07/2024)   SDOH Interventions: Housing Interventions: Intervention Not Indicated Transportation Interventions: Intervention Not Indicated  Utilities Interventions: Intervention Not Indicated Social Connections Interventions: Intervention Not Indicated   Readmission Risk Interventions     No data to display

## 2024-02-10 NOTE — Progress Notes (Signed)
 PROGRESS NOTE    Mourad Cwikla  FMW:[id number] DOB: [birth year] DOA: 02/07/2024 PCP: Campbell Reynolds, NP   Chief Complaint  Patient presents with   Numbness   Weakness    Brief Narrative:   Jonathon Harper is a 68 y.o. male with PMH of 68 y.o. male with medical history significant for hypertension and CKD 3A who presents with gait instability, right-sided numbness and weakness, and slurred speech.   CT is concerning for new right cerebellar hypodensity consistent with acute infarct and CTA findings raise concern for right vertebral artery thrombus.   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 metallic foreign body noted on CT that precludes MRI. - Echo EF 6065% -Lower extremity venous Doppler still pending -LDL 100- hemoglobin A1c is 6 urine drug screen positive for cocaine and THC - Now on heparin  drip, likely will transition to Eliquis per neurology note, likely today, will await neurology today input.   -PT/OT/SLP consulted, CIR pending.  Hypertension  -Back on home amlodipine , blood pressure is acceptable, - out of permissive window per neurology  Cocaine abuse THC abuse -Counseled  NSVT - Patient with 6 beats of nonsustained ventricular tachycardia, 2D echo with a preserved EF, cannot start any beta-blockers given cocaine use and he is sustaining heart rate in the 50s to low 60s at baseline, will replace potassium to target> 4, and check magnesium level, to target level of>2. (heart rate in the low 60s on the monitor, 2 events of NSVT yesterday 6-7 beats)   CKD 3A  - At baseline  Tobacco abuse - Counseled  Per lipidemia - LDL is 100, now  on atorvastatin  40 mg daily  Class I Obesity  - Body mass index is 32.61 kg/m.    DVT prophylaxis: (Heparin  gtt) Code Status: (Full) Family Communication: ( none at bedside) Disposition: CIR placement once medically cleared, remains on heparin  drip  Status is: Inpatient    Consultants:  Neurology   Subjective:  No chest pain, no shortness of breath, he denies any complaints this morning  Objective: Vitals:   02/09/24 2331 02/10/24 0407 02/10/24 0516 02/10/24 0839  BP: (!) 146/93 (!) 164/100 (!) 151/96 (!) 146/92  Pulse: 63  61 63  Resp: 18 18 20 18   Temp: 98.5 F (36.9 C) 98.1 F (36.7 C)  98 F (36.7 C)  TempSrc: Oral Oral  Oral  SpO2: 97%  94% 95%  Weight:      Height:        Intake/Output Summary (Last 24 hours) at 02/10/2024 0957 Last data filed at 02/10/2024 0945 Gross per 24 hour  Intake 133.86 ml  Output 2850 ml  Net -2716.14 ml   Filed Weights   02/07/24 1954  Weight: 115.2 kg    Examination:  A, alert, oriented x 3 Good air entry bilaterally Regular rate and rhythm RRR,No Gallops +ve B.Sounds, Abd Soft, No tenderness, No Cyanosis, Clubbing or edema, No new Rash or bruise      Data Reviewed: I have personally reviewed following labs and imaging studies  CBC: Recent Labs  Lab 02/07/24 1825 02/07/24 1829 02/08/24 0412 02/10/24 0258  WBC 5.6  --  6.0 7.0  NEUTROABS 2.9  --   --   --   HGB 12.7* 14.3 12.1* 12.7*  HCT 41.3 42.0 38.5* 39.6  MCV 86.2  --  85.4 83.0  PLT 253  --  240 269    Basic Metabolic Panel: Recent Labs  Lab 02/07/24 1825 02/07/24 1829 02/08/24 0412 02/09/24 0353 02/09/24 1953 02/10/24 0258  NA 137 141 138 138  --  139  K 4.5 4.4 4.0 3.8  --  4.5  CL 106 105 105 104  --  107  CO2 23  --  21* 27  --  27  GLUCOSE 95 95 105* 98  --  102*  BUN 14 16 12 15   --  14  CREATININE 1.34* 1.50* 1.32* 1.35*  --  1.05  CALCIUM  8.7*  --  8.7* 8.7*  --  9.0  MG  --   --   --   --  1.9 1.9    GFR: Estimated  Creatinine Clearance: 90.9 mL/min (by C-G formula based on SCr of 1.05 mg/dL).  Liver Function Tests: Recent Labs  Lab 02/07/24 1825  AST 20  ALT 14  ALKPHOS 67  BILITOT 0.8  PROT 6.9  ALBUMIN 3.3*    CBG: No results for input(s): GLUCAP in the last 168 hours.   No results found for this or any previous visit (from the past 240 hours).       Radiology Studies: VAS US  LOWER EXTREMITY VENOUS (DVT) Result Date: 02/09/2024  Lower Venous DVT Study Patient Name:  THERAN VANDERGRIFT  Date of Exam:   02/09/2024 Medical Rec #: [id number]     Accession #:    [id number] Date of Birth: 09-03-1955     Patient Gender: M Patient Age:   17 years Exam Location:  [facility] Procedure:      VAS US  LOWER EXTREMITY VENOUS (DVT) Referring Phys: ARY XU --------------------------------------------------------------------------------  Indications: Stroke.  Risk Factors: None identified. Comparison Study: No prior studies. Performing Technologist: Cordella Collet RVT  Examination Guidelines: A complete evaluation includes B-mode imaging, spectral Doppler, color Doppler, and power Doppler as needed of all accessible portions of each vessel. Bilateral testing is considered an integral part of a complete examination. Limited examinations for reoccurring indications may be performed as noted. The reflux portion of the exam is performed with the patient in reverse Trendelenburg.  +---------+---------------+---------+-----------+----------+--------------+ RIGHT    CompressibilityPhasicitySpontaneityPropertiesThrombus Aging +---------+---------------+---------+-----------+----------+--------------+ CFV      Full           Yes      Yes                                 +---------+---------------+---------+-----------+----------+--------------+ SFJ      Full                                                        +---------+---------------+---------+-----------+----------+--------------+ FV Prox   Full                                                        +---------+---------------+---------+-----------+----------+--------------+ FV Mid   Full                                                        +---------+---------------+---------+-----------+----------+--------------+  FV DistalFull                                                        +---------+---------------+---------+-----------+----------+--------------+ PFV      Full                                                        +---------+---------------+---------+-----------+----------+--------------+ POP      Full           Yes      Yes                                 +---------+---------------+---------+-----------+----------+--------------+ PTV      Full                                                        +---------+---------------+---------+-----------+----------+--------------+ PERO     Full                                                        +---------+---------------+---------+-----------+----------+--------------+   +---------+---------------+---------+-----------+----------+--------------+ LEFT     CompressibilityPhasicitySpontaneityPropertiesThrombus Aging +---------+---------------+---------+-----------+----------+--------------+ CFV      Full           Yes      Yes                                 +---------+---------------+---------+-----------+----------+--------------+ SFJ      Full                                                        +---------+---------------+---------+-----------+----------+--------------+ FV Prox  Full                                                        +---------+---------------+---------+-----------+----------+--------------+ FV Mid   Full                                                        +---------+---------------+---------+-----------+----------+--------------+ FV DistalFull                                                         +---------+---------------+---------+-----------+----------+--------------+  PFV      Full                                                        +---------+---------------+---------+-----------+----------+--------------+ POP      Full           Yes      Yes                                 +---------+---------------+---------+-----------+----------+--------------+ PTV      Full                                                        +---------+---------------+---------+-----------+----------+--------------+ PERO     Full                                                        +---------+---------------+---------+-----------+----------+--------------+     Summary: RIGHT: - There is no evidence of deep vein thrombosis in the lower extremity.  - No cystic structure found in the popliteal fossa.  LEFT: - There is no evidence of deep vein thrombosis in the lower extremity.  - No cystic structure found in the popliteal fossa.  *See table(s) above for measurements and observations. Electronically signed by Lonni Gaskins MD on 02/09/2024 at 3:56:08 PM.    Final         Scheduled Meds:  amLODipine   5 mg Oral QPM   atorvastatin   40 mg Oral Daily   Continuous Infusions:  heparin  1,300 Units/hr (02/10/24 0843)     LOS: 3 days      Brayton Lye, MD [facility]   To contact the attending provider between 7A-7P or the covering provider during after hours 7P-7A, please log into the web site [email or web address] and access using universal [place] password for that web site. If you do not have the password, please call the hospital operator.  02/10/2024, 9:57 AM

## 2024-02-10 NOTE — Plan of Care (Signed)
°  Problem: Education: Goal: Knowledge of secondary prevention will improve (MUST DOCUMENT ALL) Outcome: Progressing   Problem: Ischemic Stroke/TIA Tissue Perfusion: Goal: Complications of ischemic stroke/TIA will be minimized Outcome: Progressing   Problem: Clinical Measurements: Goal: Ability to maintain clinical measurements within normal limits will improve Outcome: Progressing Goal: Diagnostic test results will improve Outcome: Progressing   Problem: Safety: Goal: Ability to remain free from injury will improve Outcome: Progressing
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
## 2024-02-10 NOTE — Plan of Care (Signed)
  Problem: Education: Goal: Knowledge of disease or condition will improve Outcome: Progressing Goal: Knowledge of secondary prevention will improve (MUST DOCUMENT ALL) Outcome: Progressing Goal: Knowledge of patient specific risk factors will improve (DELETE if not current risk factor) Outcome: Progressing   Problem: Ischemic Stroke/TIA Tissue Perfusion: Goal: Complications of ischemic stroke/TIA will be minimized Outcome: Progressing

## 2024-02-10 NOTE — Progress Notes (Signed)
 Inpatient Rehab Admissions Coordinator:    I await insurance auth for CIR. Will continue to follow. I spoke with Pt.'s girlfriend Freddy and she confirmed he can d/c home with her and she can  provide 24/7 Support.  Leita Kleine, MS, CCC-SLP Rehab Admissions Coordinator  [phone] (celll) [phone] (office)

## 2024-02-10 NOTE — TOC CAGE-AID Note (Signed)
 Transition of Care [facility]) - CAGE-AID Screening   Patient Details  Name: Jonathon Harper MRN: [id number] Date of Birth: 1955-10-23  Transition of Care [facility]) CM/SW Contact:    Landry DELENA Senters, RN Phone Number: 02/10/2024, 10:13 AM   Clinical Narrative:  Patient reports he uses occasional marijuana, but will not be doing this anymore. Patient reports no other drug or alcohol use. Denies need for counseling resources for marijuana use.   CAGE-AID Screening: Substance Abuse Screening unable to be completed due to: : Patient Refused  Have You Ever Felt You Ought to Cut Down on Your Drinking or Drug Use?: No Have People Annoyed You By Critizing Your Drinking Or Drug Use?: No Have You Felt Bad Or Guilty About Your Drinking Or Drug Use?: No Have You Ever Had a Drink or Used Drugs First Thing In The Morning to Steady Your Nerves or to Get Rid of a Hangover?: No CAGE-AID Score: 0  Substance Abuse Education Offered: No
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
## 2024-02-10 NOTE — Care Management Important Message (Signed)
 Important Message  Patient Details  Name: Jonathon Harper MRN: [id number] Date of Birth: [birth year]   Important Message Given:  Yes - Medicare IM     Claretta Deed 02/10/2024, 3:09 PM

## 2024-02-11 LAB — MAGNESIUM: Magnesium: 2 mg/dL (ref 1.7–2.4)

## 2024-02-11 LAB — BASIC METABOLIC PANEL WITH GFR
Anion gap: 7 (ref 5–15)
BUN: 16 mg/dL (ref 8–23)
CO2: 26 mmol/L (ref 22–32)
Calcium: 8.8 mg/dL — ABNORMAL LOW (ref 8.9–10.3)
Chloride: 102 mmol/L (ref 98–111)
Creatinine, Ser: 1.12 mg/dL (ref 0.61–1.24)
GFR, Estimated: >60 mL/min (ref >60)
Glucose, Bld: 113 mg/dL — ABNORMAL HIGH (ref 70–99)
Potassium: 4.1 mmol/L (ref 3.5–5.1)
Sodium: 135 mmol/L (ref 135–145)

## 2024-02-11 LAB — CBC
HCT: 41.9 % (ref 39.0–52.0)
Hemoglobin: 13.4 g/dL (ref 13.0–17.0)
MCH: 26.4 pg (ref 26.0–34.0)
MCHC: 32 g/dL (ref 30.0–36.0)
MCV: 82.6 fL (ref 80.0–100.0)
Platelets: 276 K/uL (ref 150–400)
RBC: 5.07 MIL/uL (ref 4.22–5.81)
RDW: 14.9 % (ref 11.5–15.5)
WBC: 7 K/uL (ref 4.0–10.5)
nRBC: 0 % (ref 0.0–0.2)

## 2024-02-11 MED ORDER — METOPROLOL TARTRATE 12.5 MG HALF TABLET
12.5000 mg | ORAL_TABLET | Freq: Two times a day (BID) | ORAL | Status: DC
  Administered 2024-02-11 – 2024-02-14 (×7): 12.5 mg via ORAL
  Filled 2024-02-11 (×7): qty 1

## 2024-02-11 MED FILL — Pneumococcal 20-Valent Conjugate Vaccine Sus Pref Syr 0.5 ML: 0.5000 mL | INTRAMUSCULAR | Qty: 0.5 | Status: AC

## 2024-02-11 MED FILL — Influenza Virus Vac Split High-Dose PF Susp Pref Syr 0.5ML: 0.5000 mL | INTRAMUSCULAR | Qty: 0.5 | Status: AC

## 2024-02-11 NOTE — Plan of Care (Signed)
°  Problem: Education: Goal: Knowledge of disease or condition will improve Outcome: Progressing   Problem: Ischemic Stroke/TIA Tissue Perfusion: Goal: Complications of ischemic stroke/TIA will be minimized Outcome: Progressing   Problem: Coping: Goal: Will verbalize positive feelings about self Outcome: Progressing   Problem: Self-Care: Goal: Ability to participate in self-care as condition permits will improve Outcome: Progressing   Problem: Clinical Measurements: Goal: Will remain free from infection Outcome: Progressing Goal: Diagnostic test results will improve Outcome: Progressing   Problem: Activity: Goal: Risk for activity intolerance will decrease Outcome: Progressing   Problem: Coping: Goal: Level of anxiety will decrease Outcome: Progressing

## 2024-02-11 NOTE — Plan of Care (Signed)
  Problem: Coping: Goal: Will verbalize positive feelings about self Outcome: Progressing   Problem: Self-Care: Goal: Ability to communicate needs accurately will improve Outcome: Progressing   

## 2024-02-11 NOTE — Discharge Instructions (Signed)
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
## 2024-02-11 NOTE — Progress Notes (Signed)
Inpatient Rehab Admissions Coordinator:  Awaiting insurance authorization. Will continue to follow.   Gayland Curry, [place], [place] Admissions Coordinator [phone]
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
## 2024-02-11 NOTE — Progress Notes (Signed)
 PROGRESS NOTE    [name]  FMW:[id number] DOB: 1955-04-13 DOA: 02/07/2024 PCP: Campbell Reynolds, NP   Chief Complaint  Patient presents with   Numbness   Weakness    Brief Narrative:   Jonathon Harper is a 68 y.o. male with PMH of 68 y.o. male with medical history significant for hypertension and CKD 3A who presents with gait instability, right-sided numbness and weakness, and slurred speech.   CT is concerning for new right cerebellar hypodensity consistent with acute infarct and CTA findings raise concern for right vertebral artery thrombus.   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 metallic foreign body noted on CT that precludes MRI. - Echo EF 6065% -Lower extremity venous Doppler still pending -LDL 100- hemoglobin A1c is 6 urine drug screen positive for cocaine and THC - Initially on heparin  drip, now transition to Eliquis per neurorecommendations, per neuro Follow up at [facility] to repeat CTA in 2 months, if R VA thrombus resolves, may transition to antiplatelet -PT/OT/SLP consulted, CIR pending.  Hypertension  -Back on home amlodipine , blood pressure is acceptable, - out of permissive window per neurology  Cocaine abuse THC abuse -Counseled  NSVT - Patient with 6 beats of nonsustained ventricular tachycardia, 2D echo with a preserved EF. -replace potassium to target> 4, and check magnesium level, to target level of>2. -Now heart rate in the mid 70s, will start on low-dose beta-blockers  CKD 3A  - At baseline  Tobacco abuse - Counseled  Per lipidemia - LDL is 100, now on atorvastatin  40 mg daily  Class I Obesity  - Body mass  index is 32.61 kg/m.    DVT prophylaxis: (Eliquis) Code Status: (Full) Family Communication: ( none at bedside) Disposition: Is medically stable to discharge to CIR once bed is available   Status is: Inpatient    Consultants:  Neurology   Subjective:  Denies any complaints this morning  Objective: Vitals:   02/11/24 0021 02/11/24 0423 02/11/24 0905 02/11/24 1210  BP: (!) 152/94 (!) 129/95 110/79 (!) 153/96  Pulse:  81 76 92  Resp:  14 15 20   Temp: 98.3 F (36.8 C) 98.2 F (36.8 C) 98 F (36.7 C) 98.4 F (36.9 C)  TempSrc: Oral Oral Oral Oral  SpO2:  91% 91% 92%  Weight:      Height:        Intake/Output Summary (Last 24 hours) at 02/11/2024 1355 Last data filed at 02/11/2024 0423 Gross per 24 hour  Intake 267.39 ml  Output 1000 ml  Net -732.61 ml   Filed Weights   02/07/24 1954  Weight: 115.2 kg    Examination:  Awake, alert, no apparent distress Good air entry Regular rate and rhythm +ve B.Sounds, Abd Soft, No tenderness, No Cyanosis, Clubbing or edema, No new Rash or bruise      Data Reviewed: I have personally reviewed following labs and imaging studies  CBC: Recent Labs  Lab 02/07/24 1825 02/07/24 1829 02/08/24 0412 02/10/24 0258 02/11/24 0411  WBC 5.6  --  6.0 7.0 7.0  NEUTROABS 2.9  --   --   --   --   HGB 12.7* 14.3 12.1* 12.7*  13.4  HCT 41.3 42.0 38.5* 39.6 41.9  MCV 86.2  --  85.4 83.0 82.6  PLT 253  --  240 269 276    Basic Metabolic Panel: Recent Labs  Lab 02/07/24 1825 02/07/24 1829 02/08/24 0412 02/09/24 0353 02/09/24 1953 02/10/24 0258 02/11/24 0411  NA 137 141 138 138  --  139 135  K 4.5 4.4 4.0 3.8  --  4.5 4.1  CL 106 105 105 104  --  107 102  CO2 23  --  21* 27  --  27 26  GLUCOSE 95 95 105* 98  --  102* 113*  BUN 14 16 12 15   --  14 16  CREATININE 1.34* 1.50* 1.32* 1.35*  --  1.05 1.12  CALCIUM  8.7*  --  8.7* 8.7*  --  9.0 8.8*  MG  --   --   --   --  1.9 1.9 2.0    GFR: Estimated Creatinine  Clearance: 85.2 mL/min (by C-G formula based on SCr of 1.12 mg/dL).  Liver Function Tests: Recent Labs  Lab 02/07/24 1825  AST 20  ALT 14  ALKPHOS 67  BILITOT 0.8  PROT 6.9  ALBUMIN 3.3*    CBG: No results for input(s): GLUCAP in the last 168 hours.   No results found for this or any previous visit (from the past 240 hours).       Radiology Studies: No results found.       Scheduled Meds:  amLODipine   5 mg Oral QPM   apixaban  5 mg Oral BID   atorvastatin   40 mg Oral Daily   metoprolol tartrate  12.5 mg Oral BID   nicotine  21 mg Transdermal Daily   Continuous Infusions:     LOS: 4 days      Brayton Lye, MD [facility]   To contact the attending provider between 7A-7P or the covering provider during after hours 7P-7A, please log into the web site [email or web address] and access using universal [place] password for that web site. If you do not have the password, please call the hospital operator.  02/11/2024, 1:55 PM
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
## 2024-02-12 NOTE — Plan of Care (Signed)
  Problem: Education: Goal: Knowledge of disease or condition will improve Outcome: Progressing Goal: Knowledge of secondary prevention will improve (MUST DOCUMENT ALL) Outcome: Progressing Goal: Knowledge of patient specific risk factors will improve (DELETE if not current risk factor) Outcome: Progressing   Problem: Ischemic Stroke/TIA Tissue Perfusion: Goal: Complications of ischemic stroke/TIA will be minimized Outcome: Progressing   Problem: Coping: Goal: Will verbalize positive feelings about self Outcome: Progressing Goal: Will identify appropriate support needs Outcome: Progressing   Problem: Health Behavior/Discharge Planning: Goal: Ability to manage health-related needs will improve Outcome: Progressing Goal: Goals will be collaboratively established with patient/family Outcome: Progressing   Problem: Self-Care: Goal: Ability to participate in self-care as condition permits will improve Outcome: Progressing Goal: Verbalization of feelings and concerns over difficulty with self-care will improve Outcome: Progressing Goal: Ability to communicate needs accurately will improve Outcome: Progressing   Problem: Nutrition: Goal: Risk of aspiration will decrease Outcome: Progressing Goal: Dietary intake will improve Outcome: Progressing   Problem: Education: Goal: Knowledge of General Education information will improve Description: Including pain rating scale, medication(s)/side effects and non-pharmacologic comfort measures Outcome: Progressing   Problem: Health Behavior/Discharge Planning: Goal: Ability to manage health-related needs will improve Outcome: Progressing   Problem: Clinical Measurements: Goal: Ability to maintain clinical measurements within normal limits will improve Outcome: Progressing Goal: Will remain free from infection Outcome: Progressing Goal: Diagnostic test results will improve Outcome: Progressing Goal: Respiratory complications will  improve Outcome: Progressing Goal: Cardiovascular complication will be avoided Outcome: Progressing   Problem: Activity: Goal: Risk for activity intolerance will decrease Outcome: Progressing   Problem: Nutrition: Goal: Adequate nutrition will be maintained Outcome: Progressing   Problem: Coping: Goal: Level of anxiety will decrease Outcome: Progressing   Problem: Elimination: Goal: Will not experience complications related to bowel motility Outcome: Progressing Goal: Will not experience complications related to urinary retention Outcome: Progressing   Problem: Pain Managment: Goal: General experience of comfort will improve and/or be controlled Outcome: Progressing   Problem: Safety: Goal: Ability to remain free from injury will improve Outcome: Progressing   Problem: Skin Integrity: Goal: Risk for impaired skin integrity will decrease Outcome: Progressing   Problem: Education: Goal: Knowledge of disease or condition will improve Outcome: Progressing Goal: Knowledge of secondary prevention will improve (MUST DOCUMENT ALL) Outcome: Progressing Goal: Knowledge of patient specific risk factors will improve (DELETE if not current risk factor) Outcome: Progressing   Problem: Intracerebral Hemorrhage Tissue Perfusion: Goal: Complications of Intracerebral Hemorrhage will be minimized Outcome: Progressing   Problem: Coping: Goal: Will verbalize positive feelings about self Outcome: Progressing Goal: Will identify appropriate support needs Outcome: Progressing   Problem: Health Behavior/Discharge Planning: Goal: Ability to manage health-related needs will improve Outcome: Progressing Goal: Goals will be collaboratively established with patient/family Outcome: Progressing   Problem: Self-Care: Goal: Ability to participate in self-care as condition permits will improve Outcome: Progressing Goal: Verbalization of feelings and concerns over difficulty with self-care  will improve Outcome: Progressing Goal: Ability to communicate needs accurately will improve Outcome: Progressing   Problem: Nutrition: Goal: Risk of aspiration will decrease Outcome: Progressing Goal: Dietary intake will improve Outcome: Progressing

## 2024-02-12 NOTE — Progress Notes (Signed)
 PROGRESS NOTE    Jonathon Harper  FMW:[id number] DOB: 05-08-1955 DOA: 02/07/2024 PCP: Campbell Reynolds, NP   Chief Complaint  Patient presents with   Numbness   Weakness    Brief Narrative:   Jonathon Harper is a 68 y.o. male with PMH of 68 y.o. male with medical history significant for hypertension and CKD 3A who presents with gait instability, right-sided numbness and weakness, and slurred speech.   CT is concerning for new right cerebellar hypodensity consistent with acute infarct and CTA findings raise concern for right vertebral artery thrombus.   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 metallic foreign body noted on CT that precludes MRI. - Echo EF 6065% -Lower extremity venous Doppler still pending -LDL 100- hemoglobin A1c is 6 urine drug screen positive for cocaine and THC - Initially on heparin  drip, now transition to Eliquis  per neurorecommendations, per neuro Follow up at [facility] to repeat CTA in 2 months, if R VA thrombus resolves, may transition to antiplatelet -PT/OT/SLP consulted, CIR pending.  Hypertension  -Back on home amlodipine , blood pressure is acceptable, - out of permissive window per neurology  Cocaine abuse THC abuse -Counseled  NSVT - Patient with 6 beats of nonsustained ventricular tachycardia, 2D echo with a preserved EF. -replace potassium to target> 4, and check magnesium  level, to target level of>2. -Now heart rate in the mid 70s, started  on low-dose beta-blockers  CKD 3A  - At baseline  Tobacco abuse - Counseled  Per lipidemia - LDL is 100, now on atorvastatin  40 mg daily  Class I Obesity  - Body mass  index is 32.61 kg/m.    DVT prophylaxis: (Eliquis ) Code Status: (Full) Family Communication: ( none at bedside) Disposition: Is medically stable to discharge to CIR once insurance authorization obtained  Status is: Inpatient    Consultants:  Neurology   Subjective:  Denies any complaints this morning  Objective: Vitals:   02/11/24 1714 02/11/24 2128 02/12/24 0030 02/12/24 0811  BP: (!) 121/90 (!) 137/107 131/75 136/86  Pulse: 77 75 71   Resp: 17 16 19    Temp: 98.1 F (36.7 C) 98.3 F (36.8 C) 98.2 F (36.8 C) (!) 97.2 F (36.2 C)  TempSrc: Oral Oral Oral Oral  SpO2: 92% 96% 94%   Weight:      Height:        Intake/Output Summary (Last 24 hours) at 02/12/2024 1040 Last data filed at 02/11/2024 1139 Gross per 24 hour  Intake --  Output 600 ml  Net -600 ml   Filed Weights   02/07/24 1954  Weight: 115.2 kg    Examination:  Awake, alert, no apparent distress Good air entry Regular rate and rhythm Abdomen soft Extremities with no edema    Data Reviewed: I have personally reviewed following labs and imaging studies  CBC: Recent Labs  Lab 02/07/24 1825 02/07/24 1829 02/08/24 0412 02/10/24 0258 02/11/24 0411  WBC 5.6  --  6.0 7.0 7.0  NEUTROABS 2.9  --   --   --   --   HGB 12.7* 14.3 12.1* 12.7* 13.4  HCT 41.3 42.0 38.5* 39.6 41.9  MCV 86.2  --  85.4 83.0 82.6  PLT 253  --  240 269 276    Basic Metabolic Panel: Recent Labs  Lab 02/07/24 1825 02/07/24 1829 02/08/24 0412 02/09/24 0353 02/09/24 1953 02/10/24 0258 02/11/24 0411  NA 137 141 138 138  --  139 135  K 4.5 4.4 4.0 3.8  --  4.5 4.1  CL 106 105 105 104  --  107 102  CO2 23  --  21* 27  --  27 26  GLUCOSE 95 95 105* 98  --  102* 113*  BUN 14 16 12 15   --  14 16  CREATININE 1.34* 1.50* 1.32* 1.35*  --  1.05 1.12  CALCIUM  8.7*  --  8.7* 8.7*  --  9.0 8.8*  MG  --   --   --   --  1.9 1.9 2.0    GFR: Estimated Creatinine Clearance: 85.2 mL/min (by C-G formula based on SCr of  1.12 mg/dL).  Liver Function Tests: Recent Labs  Lab 02/07/24 1825  AST 20  ALT 14  ALKPHOS 67  BILITOT 0.8  PROT 6.9  ALBUMIN 3.3*    CBG: No results for input(s): GLUCAP in the last 168 hours.   No results found for this or any previous visit (from the past 240 hours).       Radiology Studies: No results found.       Scheduled Meds:  amLODipine   5 mg Oral QPM   apixaban   5 mg Oral BID   atorvastatin   40 mg Oral Daily   Influenza vac split trivalent PF  0.5 mL Intramuscular Tomorrow-1000   metoprolol  tartrate  12.5 mg Oral BID   nicotine   21 mg Transdermal Daily   pneumococcal 20-valent conjugate vaccine  0.5 mL Intramuscular Tomorrow-1000   Continuous Infusions:     LOS: 5 days      Brayton Lye, MD [facility]   To contact the attending provider between 7A-7P or the covering provider during after hours 7P-7A, please log into the web site [email or web address] and access using universal [place] password for that web site. If you do not have the password, please call the hospital operator.  02/12/2024, 10:40 AM

## 2024-02-12 NOTE — Plan of Care (Signed)
°  Problem: Education: Goal: Knowledge of disease or condition will improve Outcome: Progressing   Problem: Ischemic Stroke/TIA Tissue Perfusion: Goal: Complications of ischemic stroke/TIA will be minimized Outcome: Progressing   Problem: Health Behavior/Discharge Planning: Goal: Ability to manage health-related needs will improve Outcome: Progressing   Problem: Health Behavior/Discharge Planning: Goal: Ability to manage health-related needs will improve Outcome: Progressing   Problem: Clinical Measurements: Goal: Cardiovascular complication will be avoided Outcome: Progressing   Problem: Activity: Goal: Risk for activity intolerance will decrease Outcome: Progressing   Problem: Coping: Goal: Level of anxiety will decrease Outcome: Progressing

## 2024-02-12 NOTE — Plan of Care (Signed)
°  Problem: Self-Care: Goal: Ability to participate in self-care as condition permits will improve Outcome: Progressing   Problem: Safety: Goal: Ability to remain free from injury will improve Outcome: Progressing

## 2024-02-13 DIAGNOSIS — I639 Cerebral infarction, unspecified: Secondary | ICD-10-CM | POA: Diagnosis not present
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
## 2024-02-13 NOTE — Plan of Care (Signed)
  Problem: Education: Goal: Knowledge of disease or condition will improve Outcome: Progressing Goal: Knowledge of secondary prevention will improve (MUST DOCUMENT ALL) Outcome: Progressing Goal: Knowledge of patient specific risk factors will improve (DELETE if not current risk factor) Outcome: Progressing   Problem: Ischemic Stroke/TIA Tissue Perfusion: Goal: Complications of ischemic stroke/TIA will be minimized Outcome: Progressing   Problem: Coping: Goal: Will verbalize positive feelings about self Outcome: Progressing Goal: Will identify appropriate support needs Outcome: Progressing   Problem: Health Behavior/Discharge Planning: Goal: Ability to manage health-related needs will improve Outcome: Progressing Goal: Goals will be collaboratively established with patient/family Outcome: Progressing   Problem: Self-Care: Goal: Ability to participate in self-care as condition permits will improve Outcome: Progressing Goal: Verbalization of feelings and concerns over difficulty with self-care will improve Outcome: Progressing Goal: Ability to communicate needs accurately will improve Outcome: Progressing   Problem: Nutrition: Goal: Risk of aspiration will decrease Outcome: Progressing Goal: Dietary intake will improve Outcome: Progressing   Problem: Education: Goal: Knowledge of General Education information will improve Description: Including pain rating scale, medication(s)/side effects and non-pharmacologic comfort measures Outcome: Progressing   Problem: Health Behavior/Discharge Planning: Goal: Ability to manage health-related needs will improve Outcome: Progressing   Problem: Clinical Measurements: Goal: Ability to maintain clinical measurements within normal limits will improve Outcome: Progressing Goal: Will remain free from infection Outcome: Progressing Goal: Diagnostic test results will improve Outcome: Progressing Goal: Respiratory complications will  improve Outcome: Progressing Goal: Cardiovascular complication will be avoided Outcome: Progressing   Problem: Activity: Goal: Risk for activity intolerance will decrease Outcome: Progressing   Problem: Nutrition: Goal: Adequate nutrition will be maintained Outcome: Progressing   Problem: Coping: Goal: Level of anxiety will decrease Outcome: Progressing   Problem: Elimination: Goal: Will not experience complications related to bowel motility Outcome: Progressing Goal: Will not experience complications related to urinary retention Outcome: Progressing   Problem: Pain Managment: Goal: General experience of comfort will improve and/or be controlled Outcome: Progressing   Problem: Safety: Goal: Ability to remain free from injury will improve Outcome: Progressing   Problem: Skin Integrity: Goal: Risk for impaired skin integrity will decrease Outcome: Progressing   Problem: Education: Goal: Knowledge of disease or condition will improve Outcome: Progressing Goal: Knowledge of secondary prevention will improve (MUST DOCUMENT ALL) Outcome: Progressing Goal: Knowledge of patient specific risk factors will improve (DELETE if not current risk factor) Outcome: Progressing   Problem: Intracerebral Hemorrhage Tissue Perfusion: Goal: Complications of Intracerebral Hemorrhage will be minimized Outcome: Progressing   Problem: Coping: Goal: Will verbalize positive feelings about self Outcome: Progressing Goal: Will identify appropriate support needs Outcome: Progressing   Problem: Health Behavior/Discharge Planning: Goal: Ability to manage health-related needs will improve Outcome: Progressing Goal: Goals will be collaboratively established with patient/family Outcome: Progressing   Problem: Self-Care: Goal: Ability to participate in self-care as condition permits will improve Outcome: Progressing Goal: Verbalization of feelings and concerns over difficulty with self-care  will improve Outcome: Progressing Goal: Ability to communicate needs accurately will improve Outcome: Progressing   Problem: Nutrition: Goal: Risk of aspiration will decrease Outcome: Progressing Goal: Dietary intake will improve Outcome: Progressing

## 2024-02-13 NOTE — Plan of Care (Signed)
  Problem: Self-Care: Goal: Verbalization of feelings and concerns over difficulty with self-care will improve Outcome: Progressing   Problem: Nutrition: Goal: Risk of aspiration will decrease Outcome: Progressing

## 2024-02-14 ENCOUNTER — Inpatient Hospital Stay (HOSPITAL_COMMUNITY)
Admission: AD | Admit: 2024-02-14 | Disposition: A | Source: Intra-hospital | Admitting: Physical Medicine and Rehabilitation

## 2024-02-14 ENCOUNTER — Encounter (HOSPITAL_COMMUNITY): Payer: Self-pay | Admitting: Physical Medicine and Rehabilitation

## 2024-02-14 ENCOUNTER — Other Ambulatory Visit: Payer: Self-pay

## 2024-02-14 DIAGNOSIS — K5900 Constipation, unspecified: Secondary | ICD-10-CM | POA: Diagnosis present

## 2024-02-14 DIAGNOSIS — I4891 Unspecified atrial fibrillation: Secondary | ICD-10-CM | POA: Diagnosis present

## 2024-02-14 DIAGNOSIS — Z791 Long term (current) use of non-steroidal anti-inflammatories (NSAID): Secondary | ICD-10-CM | POA: Diagnosis not present

## 2024-02-14 DIAGNOSIS — Z7901 Long term (current) use of anticoagulants: Secondary | ICD-10-CM

## 2024-02-14 DIAGNOSIS — F17210 Nicotine dependence, cigarettes, uncomplicated: Secondary | ICD-10-CM | POA: Diagnosis present

## 2024-02-14 DIAGNOSIS — N1831 Chronic kidney disease, stage 3a: Secondary | ICD-10-CM | POA: Diagnosis present

## 2024-02-14 DIAGNOSIS — I63011 Cerebral infarction due to thrombosis of right vertebral artery: Secondary | ICD-10-CM

## 2024-02-14 DIAGNOSIS — Z9181 History of falling: Secondary | ICD-10-CM | POA: Diagnosis not present

## 2024-02-14 DIAGNOSIS — I69392 Facial weakness following cerebral infarction: Secondary | ICD-10-CM

## 2024-02-14 DIAGNOSIS — I639 Cerebral infarction, unspecified: Principal | ICD-10-CM | POA: Diagnosis present

## 2024-02-14 DIAGNOSIS — F1210 Cannabis abuse, uncomplicated: Secondary | ICD-10-CM | POA: Diagnosis present

## 2024-02-14 DIAGNOSIS — I10 Essential (primary) hypertension: Secondary | ICD-10-CM | POA: Diagnosis not present

## 2024-02-14 DIAGNOSIS — I472 Ventricular tachycardia, unspecified: Secondary | ICD-10-CM | POA: Diagnosis present

## 2024-02-14 DIAGNOSIS — I129 Hypertensive chronic kidney disease with stage 1 through stage 4 chronic kidney disease, or unspecified chronic kidney disease: Secondary | ICD-10-CM | POA: Diagnosis present

## 2024-02-14 DIAGNOSIS — E785 Hyperlipidemia, unspecified: Secondary | ICD-10-CM | POA: Diagnosis present

## 2024-02-14 DIAGNOSIS — M25561 Pain in right knee: Secondary | ICD-10-CM | POA: Diagnosis present

## 2024-02-14 DIAGNOSIS — I6501 Occlusion and stenosis of right vertebral artery: Secondary | ICD-10-CM | POA: Diagnosis present

## 2024-02-14 DIAGNOSIS — F1910 Other psychoactive substance abuse, uncomplicated: Secondary | ICD-10-CM

## 2024-02-14 DIAGNOSIS — I69322 Dysarthria following cerebral infarction: Secondary | ICD-10-CM

## 2024-02-14 DIAGNOSIS — Z79899 Other long term (current) drug therapy: Secondary | ICD-10-CM | POA: Diagnosis not present

## 2024-02-14 DIAGNOSIS — F1410 Cocaine abuse, uncomplicated: Secondary | ICD-10-CM | POA: Diagnosis present

## 2024-02-14 DIAGNOSIS — I69351 Hemiplegia and hemiparesis following cerebral infarction affecting right dominant side: Principal | ICD-10-CM

## 2024-02-14 DIAGNOSIS — G8929 Other chronic pain: Secondary | ICD-10-CM | POA: Diagnosis present

## 2024-02-14 DIAGNOSIS — Z880 Allergy status to penicillin: Secondary | ICD-10-CM | POA: Diagnosis not present

## 2024-02-14 DIAGNOSIS — Z602 Problems related to living alone: Secondary | ICD-10-CM | POA: Diagnosis present

## 2024-02-14 MED ORDER — ACETAMINOPHEN 650 MG RE SUPP: 650.0000 mg | RECTAL | Status: DC | PRN

## 2024-02-14 MED ORDER — ACETAMINOPHEN 325 MG PO TABS
650.0000 mg | ORAL_TABLET | ORAL | Status: DC | PRN
  Filled 2024-02-14: qty 2

## 2024-02-14 MED ORDER — SENNOSIDES-DOCUSATE SODIUM 8.6-50 MG PO TABS: 1.0000 | ORAL_TABLET | Freq: Every evening | ORAL | Status: DC | PRN

## 2024-02-14 MED ORDER — PROCHLORPERAZINE 25 MG RE SUPP: 12.5000 mg | Freq: Four times a day (QID) | RECTAL | Status: DC | PRN

## 2024-02-14 MED ORDER — AMLODIPINE BESYLATE 5 MG PO TABS
5.0000 mg | ORAL_TABLET | Freq: Every evening | ORAL | Status: DC
  Administered 2024-02-14 – 2024-02-15 (×2): 5 mg via ORAL
  Filled 2024-02-14 (×4): qty 1

## 2024-02-14 MED ORDER — PROCHLORPERAZINE MALEATE 5 MG PO TABS: 5.0000 mg | ORAL_TABLET | Freq: Four times a day (QID) | ORAL | Status: DC | PRN

## 2024-02-14 MED ORDER — DIPHENHYDRAMINE HCL 25 MG PO CAPS: 25.0000 mg | ORAL_CAPSULE | Freq: Four times a day (QID) | ORAL | Status: DC | PRN

## 2024-02-14 MED ORDER — BISACODYL 10 MG RE SUPP: 10.0000 mg | Freq: Every day | RECTAL | Status: DC | PRN

## 2024-02-14 MED ORDER — TRAZODONE HCL 50 MG PO TABS: 25.0000 mg | ORAL_TABLET | Freq: Every evening | ORAL | Status: DC | PRN

## 2024-02-14 MED ORDER — ACETAMINOPHEN 160 MG/5ML PO SOLN: 650.0000 mg | ORAL | Status: DC | PRN

## 2024-02-14 MED ORDER — APIXABAN 5 MG PO TABS: 5.0000 mg | ORAL_TABLET | Freq: Two times a day (BID) | ORAL | Status: AC

## 2024-02-14 MED ORDER — PROCHLORPERAZINE EDISYLATE 10 MG/2ML IJ SOLN: 5.0000 mg | Freq: Four times a day (QID) | INTRAMUSCULAR | Status: DC | PRN

## 2024-02-14 MED ORDER — GUAIFENESIN-DM 100-10 MG/5ML PO SYRP: 5.0000 mL | ORAL_SOLUTION | Freq: Four times a day (QID) | ORAL | Status: DC | PRN

## 2024-02-14 MED ORDER — CYCLOBENZAPRINE HCL 5 MG PO TABS: 10.0000 mg | ORAL_TABLET | Freq: Three times a day (TID) | ORAL | Status: DC | PRN

## 2024-02-14 MED ORDER — ATORVASTATIN CALCIUM 40 MG PO TABS: 40.0000 mg | ORAL_TABLET | Freq: Every day | ORAL | Status: AC

## 2024-02-14 MED ORDER — FLEET ENEMA RE ENEM: 1.0000 | ENEMA | Freq: Once | RECTAL | Status: DC | PRN

## 2024-02-14 MED ORDER — NICOTINE 21 MG/24HR TD PT24: 21.0000 mg | MEDICATED_PATCH | Freq: Every day | TRANSDERMAL | Status: DC

## 2024-02-14 MED ORDER — APIXABAN 5 MG PO TABS
5.0000 mg | ORAL_TABLET | Freq: Two times a day (BID) | ORAL | Status: DC
  Administered 2024-02-14 – 2024-02-17 (×6): 5 mg via ORAL
  Filled 2024-02-14 (×7): qty 1

## 2024-02-14 MED ORDER — MECLIZINE HCL 25 MG PO TABS: 25.0000 mg | ORAL_TABLET | Freq: Three times a day (TID) | ORAL | Status: DC | PRN

## 2024-02-14 MED ORDER — METOPROLOL TARTRATE 12.5 MG HALF TABLET
12.5000 mg | ORAL_TABLET | Freq: Two times a day (BID) | ORAL | Status: DC
  Administered 2024-02-14 – 2024-02-17 (×6): 12.5 mg via ORAL
  Filled 2024-02-14 (×7): qty 1

## 2024-02-14 MED ORDER — ATORVASTATIN CALCIUM 40 MG PO TABS
40.0000 mg | ORAL_TABLET | Freq: Every day | ORAL | Status: DC
  Administered 2024-02-15 – 2024-02-17 (×3): 40 mg via ORAL
  Filled 2024-02-14 (×3): qty 1

## 2024-02-14 MED ORDER — VITAMIN C 500 MG PO TABS
1000.0000 mg | ORAL_TABLET | Freq: Every day | ORAL | Status: DC
  Administered 2024-02-14 – 2024-02-17 (×4): 1000 mg via ORAL
  Filled 2024-02-14 (×3): qty 2

## 2024-02-14 MED ORDER — METOPROLOL TARTRATE 25 MG PO TABS: 12.5000 mg | ORAL_TABLET | Freq: Two times a day (BID) | ORAL | Status: DC

## 2024-02-14 MED ORDER — ALUM & MAG HYDROXIDE-SIMETH 200-200-20 MG/5ML PO SUSP: 30.0000 mL | ORAL | Status: DC | PRN

## 2024-02-14 MED ORDER — ZINC SULFATE 220 (50 ZN) MG PO CAPS
220.0000 mg | ORAL_CAPSULE | Freq: Every day | ORAL | Status: DC
  Administered 2024-02-14 – 2024-02-16 (×3): 220 mg via ORAL
  Filled 2024-02-14 (×2): qty 1

## 2024-02-14 MED ORDER — NICOTINE 21 MG/24HR TD PT24
21.0000 mg | MEDICATED_PATCH | Freq: Every day | TRANSDERMAL | Status: DC
  Administered 2024-02-15 – 2024-02-16 (×2): 21 mg via TRANSDERMAL
  Filled 2024-02-14 (×2): qty 1

## 2024-02-14 MED ADMIN — Pneumococcal 20-Valent Conjugate Vaccine Sus Pref Syr 0.5 ML: 0.5 mL | INTRAMUSCULAR | @ 11:00:00 | NDC 00005200002

## 2024-02-14 MED ADMIN — Influenza Virus Vac Split High-Dose PF Susp Pref Syr 0.5ML: 0.5 mL | INTRAMUSCULAR | @ 11:00:00 | NDC 49281012588
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
## 2024-02-14 NOTE — Progress Notes (Signed)
 Inpatient Rehabilitation Admission Medication Review by a Pharmacist  A complete drug regimen review was completed for this patient to identify any potential clinically significant medication issues.  High Risk Drug Classes Is patient taking? Indication by Medication  Antipsychotic Yes, as an intravenous medication PRN Prochlorperazine  (IV, PO or PR) - nausea  Anticoagulant Yes Apixaban  - R vertebral artery thrombus, CVA   Antibiotic No   Opioid No   Antiplatelet No   Hypoglycemics/insulin No   Vasoactive Medication Yes Amlodipine  - hypertension Metoprolol  - hypertension, NSVT  Chemotherapy No   Other Yes Atorvastatin  - hyperlipidemia Nicotine  patch - smoking cessation Vitamin C , Zinc  - supplements  PRNs: Acetaminophen  - mild pain, or temp >99.36F Maalox - indigestion Cyclobenzaprine  - muscle spasms Diphenhydramine  - itching Guaifenesin -dextromethorphan  - cough Meclizine  - dizziness Trazodone  - sleep Bisacodyl , senna-docusate, Fleets enema - constipation     Type of Medication Issue Identified Description of Issue Recommendation(s)  Drug Interaction(s) (clinically significant)     Duplicate Therapy     Allergy     No Medication Administration End Date     Incorrect Dose     Additional Drug Therapy Needed     Significant med changes from prior encounter (inform family/care partners about these prior to discharge). Amlodipine  dose decreased. Prior Ibuprofen  discontinued. Only other PTA meds was PRN cyclopbenzaprine > all other meds are new.  Communicate with patient/family prior to discharge.  Other       Clinically significant medication issues were identified that warrant physician communication and completion of prescribed/recommended actions by midnight of the next day:  No  Name of provider notified for urgent issues identified: n/a  Provider Method of Notification: n/a  Pharmacist comments:  - Per Neuro, plan CTA in 2 months and if R VA thrombus resloves,  may transition to antiplatelet; also recommended 30-day monitoring as outpatient to rule out atrial fibrillation.  Time spent performing this drug regimen review (minutes):  20   Genaro Zebedee Calin, [place] 02/14/2024 5:21 PM

## 2024-02-14 NOTE — TOC Transition Note (Signed)
 Transition of Care [facility]) - Discharge Note   Patient Details  Name: Jonathon Harper MRN: [id number] Date of Birth: 08-15-1955  [facility] CM/SW Contact:  Landry DELENA Senters, RN Phone Number: 02/14/2024, 9:56 AM   Clinical Narrative:     Patient will be transferring to CIR today.  No further needs identified by CM.  Final next level of care: IP Rehab Facility Barriers to Discharge: No Barriers Identified   Patient Goals and CMS Choice   CMS Medicare.gov Compare Post Acute Care list provided to:: Patient Choice offered to / list presented to : Patient      Discharge Placement                       Discharge Plan and Services Additional resources added to the After Visit Summary for                                       Social Drivers of Health (SDOH) Interventions SDOH Screenings   Food Insecurity: No Food Insecurity (02/09/2024)  Housing: Low Risk (02/09/2024)  Transportation Needs: No Transportation Needs (02/09/2024)  Utilities: Not At Risk (02/09/2024)  Social Connections: Moderately Isolated (02/09/2024)  Tobacco Use: High Risk (02/07/2024)     Readmission Risk Interventions     No data to display
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
## 2024-02-14 NOTE — Discharge Instructions (Signed)
 Inpatient Rehab Discharge Instructions  Jonathon Harper  Discharge date and time: 02/17/2024  Activities/Precautions/ Functional Status:  Activity: no lifting, driving, or strenuous exercise for until cleared provider   Diet: regular diet and low fat, low cholesterol diet  Wound Care: none needed   Functional status:  ___ No restrictions     ___ Walk up steps independently ___ 24/7 supervision/assistance   ___ Walk up steps with assistance ___ Intermittent supervision/assistance  ___ Bathe/dress independently ___ Walk with walker     ___ Bathe/dress with assistance _X__ Walk Independently    ___ Shower independently ___ Walk with assistance    ___ Shower with assistance __X_ No alcohol     ___ Return to work/school ________  Special Instructions:    My questions have been answered and I understand these instructions. I will adhere to these goals and the provided educational materials after my discharge from the hospital.  Patient/Caregiver Signature _______________________________ Date __________  Clinician Signature _______________________________________ Date __________  Please bring this form and your medication list with you to all your follow-up doctor's appointments.   STROKE/TIA DISCHARGE INSTRUCTIONS SMOKING Cigarette smoking nearly doubles your risk of having a stroke & is the single most alterable risk factor  If you smoke or have smoked in the last 12 months, you are advised to quit smoking for your health. Most of the excess cardiovascular risk related to smoking disappears within a year of stopping. Ask you doctor about anti-smoking medications [place] Quit Line: 1-800-QUIT NOW Free Smoking Cessation Classes [phone]  CHOLESTEROL Know your levels; limit fat & cholesterol in your diet  Lipid Panel     Component Value Date/Time   CHOL 164 02/08/2024 0412   TRIG 65 02/08/2024 0412   HDL 51 02/08/2024 0412   CHOLHDL 3.2 02/08/2024 0412   VLDL 13 02/08/2024 0412    LDLCALC 100 (H) 02/08/2024 0412     Many patients benefit from treatment even if their cholesterol is at goal. Goal: Total Cholesterol (CHOL) less than 160 Goal:  Triglycerides (TRIG) less than 150 Goal:  HDL greater than 40 Goal:  LDL (LDLCALC) less than 100   BLOOD PRESSURE American Stroke Association blood pressure target is less that 120/80 mm/Hg  Your discharge blood pressure is:  BP: (!) 137/90 Monitor your blood pressure Limit your salt and alcohol intake Many individuals will require more than one medication for high blood pressure  DIABETES (A1c is a blood sugar average for last 3 months) Goal HGBA1c is under 7% (HBGA1c is blood sugar average for last 3 months)  Diabetes:   Lab Results  Component Value Date   HGBA1C 6.0 (H) 02/08/2024    Your HGBA1c can be lowered with medications, healthy diet, and exercise. Check your blood sugar as directed by your physician Call your physician if you experience unexplained or low blood sugars.  PHYSICAL ACTIVITY/REHABILITATION Goal is 30 minutes at least 4 days per week  Activity: Increase activity slowly, and No driving, Therapies: Physical Therapy: Outpatient Return to work: not until cleared.  Activity decreases your risk of heart attack and stroke and makes your heart stronger.  It helps control your weight and blood pressure; helps you relax and can improve your mood. Participate in a regular exercise program. Talk with your doctor about the best form of exercise for you (dancing, walking, swimming, cycling).  DIET/WEIGHT Goal is to maintain a healthy weight  Your discharge diet is:  Diet Order  Diet regular Fluid consistency: Thin  Diet effective now                  Your height is:  Height: 6' 2 (188 cm) Your current weight is: Weight: 111.4 kg Your Body Mass Index (BMI) is:  BMI (Calculated): 31.51 Following the type of diet specifically designed for you will help prevent another stroke. Your goal Body  Mass Index (BMI) is 19-24. Healthy food habits can help reduce 3 risk factors for stroke:  High cholesterol, hypertension, and excess weight.  RESOURCES Stroke/Support Group:  Call [phone]   STROKE EDUCATION PROVIDED/REVIEWED AND GIVEN TO PATIENT Stroke warning signs and symptoms How to activate emergency medical system (call 911). Medications prescribed at discharge. Need for follow-up after discharge. Personal risk factors for stroke. Pneumonia vaccine given: No Flu vaccine given: No My questions have been answered, the writing is legible, and I understand these instructions.  I will adhere to these goals & educational materials that have been provided to me after my discharge from the hospital.      Information on my medicine - ELIQUIS  (apixaban )  This medication education was reviewed with me or my healthcare representative as part of my discharge preparation.   Why was Eliquis  prescribed for you? Eliquis  was prescribed for you to reduce the risk of a blood clot forming that can cause a stroke due to a blood clot (thrombus) in an artery of the brain, and if you have a medical condition called atrial fibrillation (a type of irregular heartbeat).   What do You need to know about Eliquis  ? Take your Eliquis  TWICE DAILY - one tablet in the morning and one tablet in the evening with or without food. If you have difficulty swallowing the tablet whole please discuss with your pharmacist how to take the medication safely.  Take Eliquis  exactly as prescribed by your doctor and DO NOT stop taking Eliquis  without talking to the doctor who prescribed the medication.  Stopping may increase your risk of developing a stroke.  Refill your prescription before you run out.  After discharge, you should have regular check-up appointments with your healthcare provider that is prescribing your Eliquis .  In the future your dose may need to be changed if your kidney function or weight changes by  a significant amount or as you get older.  What do you do if you miss a dose? If you miss a dose, take it as soon as you remember on the same day and resume taking twice daily.  Do not take more than one dose of ELIQUIS  at the same time to make up a missed dose.  Important Safety Information A possible side effect of Eliquis  is bleeding. You should call your healthcare provider right away if you experience any of the following: Bleeding from an injury or your nose that does not stop. Unusual colored urine (red or dark brown) or unusual colored stools (red or black). Unusual bruising for unknown reasons. A serious fall or if you hit your head (even if there is no bleeding).  Some medicines may interact with Eliquis  and might increase your risk of bleeding or clotting while on Eliquis . To help avoid this, consult your healthcare provider or pharmacist prior to using any new prescription or non-prescription medications, including herbals, vitamins, non-steroidal anti-inflammatory drugs (NSAIDs) and supplements.  This website has more information on Eliquis  (apixaban ): [email or web address] .[email or web address] dena

## 2024-02-14 NOTE — Plan of Care (Signed)
°  Problem: Education: Goal: Knowledge of disease or condition will improve Outcome: Completed/Met Goal: Knowledge of secondary prevention will improve (MUST DOCUMENT ALL) Outcome: Completed/Met Goal: Knowledge of patient specific risk factors will improve (DELETE if not current risk factor) Outcome: Completed/Met   Problem: Ischemic Stroke/TIA Tissue Perfusion: Goal: Complications of ischemic stroke/TIA will be minimized Outcome: Completed/Met   Problem: Coping: Goal: Will verbalize positive feelings about self Outcome: Completed/Met Goal: Will identify appropriate support needs Outcome: Completed/Met   Problem: Health Behavior/Discharge Planning: Goal: Ability to manage health-related needs will improve Outcome: Completed/Met Goal: Goals will be collaboratively established with patient/family Outcome: Completed/Met   Problem: Self-Care: Goal: Ability to participate in self-care as condition permits will improve Outcome: Completed/Met Goal: Verbalization of feelings and concerns over difficulty with self-care will improve Outcome: Completed/Met Goal: Ability to communicate needs accurately will improve Outcome: Completed/Met   Problem: Nutrition: Goal: Risk of aspiration will decrease Outcome: Completed/Met Goal: Dietary intake will improve Outcome: Completed/Met   Problem: Education: Goal: Knowledge of General Education information will improve Description: Including pain rating scale, medication(s)/side effects and non-pharmacologic comfort measures Outcome: Completed/Met   Problem: Health Behavior/Discharge Planning: Goal: Ability to manage health-related needs will improve Outcome: Completed/Met   Problem: Clinical Measurements: Goal: Ability to maintain clinical measurements within normal limits will improve Outcome: Completed/Met Goal: Will remain free from infection Outcome: Completed/Met Goal: Diagnostic test results will improve Outcome:  Completed/Met Goal: Respiratory complications will improve Outcome: Completed/Met Goal: Cardiovascular complication will be avoided Outcome: Completed/Met   Problem: Activity: Goal: Risk for activity intolerance will decrease Outcome: Completed/Met   Problem: Nutrition: Goal: Adequate nutrition will be maintained Outcome: Completed/Met   Problem: Coping: Goal: Level of anxiety will decrease Outcome: Completed/Met   Problem: Elimination: Goal: Will not experience complications related to bowel motility Outcome: Completed/Met Goal: Will not experience complications related to urinary retention Outcome: Completed/Met   Problem: Pain Managment: Goal: General experience of comfort will improve and/or be controlled Outcome: Completed/Met   Problem: Safety: Goal: Ability to remain free from injury will improve Outcome: Completed/Met   Problem: Skin Integrity: Goal: Risk for impaired skin integrity will decrease Outcome: Completed/Met   Problem: Education: Goal: Knowledge of disease or condition will improve Outcome: Completed/Met Goal: Knowledge of secondary prevention will improve (MUST DOCUMENT ALL) Outcome: Completed/Met Goal: Knowledge of patient specific risk factors will improve (DELETE if not current risk factor) Outcome: Completed/Met   Problem: Intracerebral Hemorrhage Tissue Perfusion: Goal: Complications of Intracerebral Hemorrhage will be minimized Outcome: Completed/Met   Problem: Coping: Goal: Will verbalize positive feelings about self Outcome: Completed/Met Goal: Will identify appropriate support needs Outcome: Completed/Met   Problem: Health Behavior/Discharge Planning: Goal: Ability to manage health-related needs will improve Outcome: Completed/Met Goal: Goals will be collaboratively established with patient/family Outcome: Completed/Met   Problem: Self-Care: Goal: Ability to participate in self-care as condition permits will improve Outcome:  Completed/Met Goal: Verbalization of feelings and concerns over difficulty with self-care will improve Outcome: Completed/Met Goal: Ability to communicate needs accurately will improve Outcome: Completed/Met   Problem: Nutrition: Goal: Risk of aspiration will decrease Outcome: Completed/Met Goal: Dietary intake will improve Outcome: Completed/Met
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
## 2024-02-14 NOTE — Progress Notes (Signed)
 Inpatient Rehab Admissions Coordinator:    I have a CIR bed for this Pt. Today. RN may call report to [phone]  Pt. To admit to CIR for estimated 7-10 days with the goal of reaching supervision to mod I goals and returning either to his own home (3rd floor apt) or home with girlfriend Freddy (single story home), depending on progress.   Leita Kleine, MS, CCC-SLP Rehab Admissions Coordinator  [phone] (celll) [phone] (office)

## 2024-02-15 ENCOUNTER — Inpatient Hospital Stay (HOSPITAL_COMMUNITY)

## 2024-02-15 LAB — CBC WITH DIFFERENTIAL/PLATELET
Abs Immature Granulocytes: 0.02 K/uL (ref 0.00–0.07)
Basophils Absolute: 0 K/uL (ref 0.0–0.1)
Basophils Relative: 1 %
Eosinophils Absolute: 0.3 K/uL (ref 0.0–0.5)
Eosinophils Relative: 5 %
HCT: 43.4 % (ref 39.0–52.0)
Hemoglobin: 13.8 g/dL (ref 13.0–17.0)
Immature Granulocytes: 0 %
Lymphocytes Relative: 25 %
Lymphs Abs: 1.7 K/uL (ref 0.7–4.0)
MCH: 26.8 pg (ref 26.0–34.0)
MCHC: 31.8 g/dL (ref 30.0–36.0)
MCV: 84.4 fL (ref 80.0–100.0)
Monocytes Absolute: 0.7 K/uL (ref 0.1–1.0)
Monocytes Relative: 10 %
Neutro Abs: 4.1 K/uL (ref 1.7–7.7)
Neutrophils Relative %: 59 %
Platelets: 292 K/uL (ref 150–400)
RBC: 5.14 MIL/uL (ref 4.22–5.81)
RDW: 14.7 % (ref 11.5–15.5)
WBC: 6.8 K/uL (ref 4.0–10.5)
nRBC: 0 % (ref 0.0–0.2)

## 2024-02-15 LAB — COMPREHENSIVE METABOLIC PANEL WITH GFR
ALT: 22 U/L (ref 0–44)
AST: 18 U/L (ref 15–41)
Albumin: 3.3 g/dL — ABNORMAL LOW (ref 3.5–5.0)
Alkaline Phosphatase: 75 U/L (ref 38–126)
Anion gap: 5 (ref 5–15)
BUN: 17 mg/dL (ref 8–23)
CO2: 27 mmol/L (ref 22–32)
Calcium: 9 mg/dL (ref 8.9–10.3)
Chloride: 103 mmol/L (ref 98–111)
Creatinine, Ser: 1.19 mg/dL (ref 0.61–1.24)
GFR, Estimated: >60 mL/min (ref >60)
Glucose, Bld: 102 mg/dL — ABNORMAL HIGH (ref 70–99)
Potassium: 4.1 mmol/L (ref 3.5–5.1)
Sodium: 135 mmol/L (ref 135–145)
Total Bilirubin: 1.3 mg/dL — ABNORMAL HIGH (ref 0.0–1.2)
Total Protein: 7.1 g/dL (ref 6.5–8.1)

## 2024-02-15 MED ORDER — MAGNESIUM OXIDE -MG SUPPLEMENT 400 (240 MG) MG PO TABS
200.0000 mg | ORAL_TABLET | Freq: Every day | ORAL | Status: DC
  Administered 2024-02-15 – 2024-02-16 (×2): 200 mg via ORAL
  Filled 2024-02-15: qty 1

## 2024-02-15 NOTE — Plan of Care (Signed)
 Per CVA 3 team; patient is doing well post right cerebellar CVA and will meet goals and be ready for discharge by Friday, 02/18/2024. He moves well and just completed a full flight in the stairwell but he has to use the handrails since his balance is a little off but he can be Mod I in the room. No PT DME needs. Already has a hurrycane. Jonathon Harper

## 2024-02-15 NOTE — Progress Notes (Signed)
 PROGRESS NOTE   Subjective/Complaints: +right knee pain, requests aspiration as he has had benefit from this in the past, asked PA to please consult ortho He has no other complaints this morning  ROS: +right knee pain   Objective:   No results found. Recent Labs    02/15/24 0525  WBC 6.8  HGB 13.8  HCT 43.4  PLT 292   Recent Labs    02/15/24 0525  NA 135  K 4.1  CL 103  CO2 27  GLUCOSE 102*  BUN 17  CREATININE 1.19  CALCIUM  9.0    Intake/Output Summary (Last 24 hours) at 02/15/2024 1100 Last data filed at 02/15/2024 0700 Gross per 24 hour  Intake 834 ml  Output --  Net 834 ml        Physical Exam: Vital Signs Height 6' 2 (1.88 m), weight 111.4 kg. Gen: no distress, normal appearing HEENT: oral mucosa pink and moist, NCAT Cardio: Reg rate Chest: normal effort, normal rate of breathing Abd: soft, non-distended Ext: no edema Psych: pleasant, normal affect Skin: intact Neurological:     Mental Status: He is alert.     Motor: No weakness.     Comments: Alert and oriented x 3. Normal insight and awareness. Intact Memory. Normal language and speech sl slurred. Cranial nerve exam unremarkable. MMT: BUE 5/5. BLE 5/5. No focal limb ataxia. Pt did exhibit truncal ataxia, leaning to the right when standing. Sensory exam normal for light touch and pain in all 4 limbs. No limb ataxia or cerebellar signs. No abnormal tone appreciated.     Psychiatric:        Mood and Affect: Mood normal.        Behavior: Behavior normal.  MSK: +right knee swelling  Assessment/Plan: 1. Functional deficits which require 3+ hours per day of interdisciplinary therapy in a comprehensive inpatient rehab setting. Physiatrist is providing close team supervision and 24 hour management of active medical problems listed below. Physiatrist and rehab team continue to assess barriers to discharge/monitor patient progress toward functional  and medical goals  Care Tool:  Bathing    Body parts bathed by patient: Right arm, Left arm, Chest, Front perineal area, Abdomen, Buttocks, Right upper leg, Right lower leg, Left upper leg, Left lower leg, Face         Bathing assist Assist Level: Supervision/Verbal cueing     Upper Body Dressing/Undressing Upper body dressing   What is the patient wearing?: Pull over shirt    Upper body assist Assist Level: Supervision/Verbal cueing    Lower Body Dressing/Undressing Lower body dressing      What is the patient wearing?: Pants, Underwear/pull up     Lower body assist Assist for lower body dressing: Contact Guard/Touching assist     Toileting Toileting    Toileting assist Assist for toileting: Supervision/Verbal cueing     Transfers Chair/bed transfer  Transfers assist     Chair/bed transfer assist level: Contact Guard/Touching assist     Locomotion Ambulation   Ambulation assist              Walk 10 feet activity   Assist  Walk 50 feet activity   Assist           Walk 150 feet activity   Assist           Walk 10 feet on uneven surface  activity   Assist           Wheelchair     Assist               Wheelchair 50 feet with 2 turns activity    Assist            Wheelchair 150 feet activity     Assist          Height 6' 2 (1.88 m), weight 111.4 kg.  Medical Problem List and Plan: 1. 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 If thrombus in right VA resolved will change to antiplatelet agent   -This patient is capable of making decisions on his own behalf.   2. Right knee pain: asked PA to please  consult ortho for right knee steroid injection   3. Hypertension: Resumed home amlodipine  5 mg daily and metoprolol  25 mg bid. Add magnesium  supplement daily  4. Constipation: magnesium  oxide ordered daily   5.Nonsustained VT Tachycardia: 12/9--6 beat run of VT. Replaced K+ to target. Follow up Labs CMP in a.m.  -Monitor HR per protocol while on metoprolol  25 mg bid.               6. CKD IIIa: Creatinine at baseline, monitor labs.    7. HLD: LDL 100, continue Lipitor 40 mg daily    8. Tobacco Abuse: Educate on cessation. Nicotine  patch.    9. Cocaine/THC abuse: counseled on cessation.   LOS: 1 days A FACE TO FACE EVALUATION WAS PERFORMED  Lilian Fuhs P Akaiya Touchette 02/15/2024, 11:00 AM

## 2024-02-15 NOTE — Progress Notes (Signed)
 Patient ID: Jonathon Harper, male   DOB: 1955-04-17, 68 y.o.   MRN: [id number]  Statement of service delivered.
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
## 2024-02-15 NOTE — Plan of Care (Signed)
°  Problem: RH Balance Goal: LTG Patient will maintain dynamic standing with ADLs (OT) Description: LTG:  Patient will maintain dynamic standing balance with assist during activities of daily living (OT)  Flowsheets (Taken 02/15/2024 1159) LTG: Pt will maintain dynamic standing balance during ADLs with: Independent with assistive device   Problem: RH Bathing Goal: LTG Patient will bathe all body parts with assist levels (OT) Description: LTG: Patient will bathe all body parts with assist levels (OT) Flowsheets (Taken 02/15/2024 1159) LTG: Pt will perform bathing with assistance level/cueing: Independent with assistive device    Problem: RH Dressing Goal: LTG Patient will perform upper body dressing (OT) Description: LTG Patient will perform upper body dressing with assist, with/without cues (OT). Flowsheets (Taken 02/15/2024 1159) LTG: Pt will perform upper body dressing with assistance level of: Independent with assistive device Goal: LTG Patient will perform lower body dressing w/assist (OT) Description: LTG: Patient will perform lower body dressing with assist, with/without cues in positioning using equipment (OT) Flowsheets (Taken 02/15/2024 1159) LTG: Pt will perform lower body dressing with assistance level of: Independent with assistive device   Problem: RH Toileting Goal: LTG Patient will perform toileting task (3/3 steps) with assistance level (OT) Description: LTG: Patient will perform toileting task (3/3 steps) with assistance level (OT)  Flowsheets (Taken 02/15/2024 1159) LTG: Pt will perform toileting task (3/3 steps) with assistance level: Independent with assistive device   Problem: RH Laundry Goal: LTG Patient will perform laundry w/assist, cues (OT) Description: LTG: Patient will perform laundry with assistance, with/without cues (OT). Flowsheets (Taken 02/15/2024 1159) LTG: Pt will perform laundry with assistance level of: Supervision/Verbal cueing   Problem: RH  Light Housekeeping Goal: LTG Patient will perform light housekeeping w/assist (OT) Description: LTG: Patient will perform light housekeeping with assistance, with/without cues (OT). Flowsheets (Taken 02/15/2024 1159) LTG: Pt will perform light housekeeping with assistance level of: Supervision/Verbal cueing   Problem: RH Toilet Transfers Goal: LTG Patient will perform toilet transfers w/assist (OT) Description: LTG: Patient will perform toilet transfers with assist, with/without cues using equipment (OT) Flowsheets (Taken 02/15/2024 1159) LTG: Pt will perform toilet transfers with assistance level of: Independent with assistive device   Problem: RH Tub/Shower Transfers Goal: LTG Patient will perform tub/shower transfers w/assist (OT) Description: LTG: Patient will perform tub/shower transfers with assist, with/without cues using equipment (OT) Flowsheets (Taken 02/15/2024 1159) LTG: Pt will perform tub/shower stall transfers with assistance level of: Independent with assistive device

## 2024-02-15 NOTE — Progress Notes (Signed)
 Inpatient Rehabilitation Center Individual Statement of Services  Patient Name:  Jonathon Harper  Date:  02/15/2024  Welcome to the Inpatient Rehabilitation Center.  Our goal is to provide you with an individualized program based on your diagnosis and situation, designed to meet your specific needs.  With this comprehensive rehabilitation program, you will be expected to participate in at least 3 hours of rehabilitation therapies Monday-Friday, with modified therapy programming on the weekends.  Your rehabilitation program will include the following services:  Physical Therapy (PT), Occupational Therapy (OT), Speech Therapy (ST), 24 hour per day rehabilitation nursing, Therapeutic Recreaction (TR), Care Coordinator, Rehabilitation Medicine, Nutrition Services, and Pharmacy Services  Weekly team conferences will be held on Wednesday to discuss your progress.  Your Inpatient Rehabilitation Care Coordinator will talk with you frequently to get your input and to update you on team discussions.  Team conferences with you and your family in attendance may also be held.  Expected length of stay: 5 days  Overall anticipated outcome: Independent with assistive device   Depending on your progress and recovery, your program may change. Your Inpatient Rehabilitation Care Coordinator will coordinate services and will keep you informed of any changes. Your Inpatient Rehabilitation Care Coordinator's name and contact numbers are listed  below.  The following services may also be recommended but are not provided by the Inpatient Rehabilitation Center:  Driving Evaluations Home Health Rehabiltiation Services Outpatient Rehabilitation Services Vocational Rehabilitation   Arrangements will be made to provide these services after discharge if needed.  Arrangements include referral to agencies that provide these services.  Your insurance has been verified to be: [employer] MEDICARE / DREMA DUAL  COMPLETE  Your primary doctor is: Campbell Reynolds, NP   Pertinent information will be shared with your doctor and your insurance company.  Inpatient Rehabilitation Care Coordinator:  Di'Asia Loreli SIERRAS [phone] or ELIGAH BRINKS  Information discussed with and copy given to patient by: Waverly Loreli, 02/15/2024, 2:22 PM

## 2024-02-15 NOTE — Progress Notes (Addendum)
 Patient ID: Jonathon Harper, male   DOB: 1955/11/12, 68 y.o.   MRN: [id number] Met with the patient to review current medical situation, right cerebellar CVA with right sided weakness and right hand dominant.   Reviewed secondary risk management including HTN and smoking/substance misuse.  Reviewed medications and dietary modification for prediabetes and HH diet.  Patient notes right knee pain with fluid on knee.  Notes aspiration with good results in the past; requesting repeat of knee aspiration, forwarded concern to MD/NP and Nurse.Plans to go home w S.O. continue to follow along to address educational needs to facilitate preparation for discharge. Jonathon Harper
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
## 2024-02-15 NOTE — Progress Notes (Signed)
 Inpatient Rehabilitation  Patient information reviewed and entered into eRehab system by [facility]. Karen Kays., CCC/SLP, PPS Coordinator.  Information including medical coding, functional ability and quality indicators will be reviewed and updated through discharge.

## 2024-02-16 ENCOUNTER — Other Ambulatory Visit (HOSPITAL_COMMUNITY): Payer: Self-pay

## 2024-02-16 DIAGNOSIS — I63011 Cerebral infarction due to thrombosis of right vertebral artery: Secondary | ICD-10-CM | POA: Diagnosis not present

## 2024-02-16 DIAGNOSIS — G8929 Other chronic pain: Secondary | ICD-10-CM | POA: Insufficient documentation

## 2024-02-16 MED ORDER — MAGNESIUM OXIDE -MG SUPPLEMENT 400 (240 MG) MG PO TABS
400.0000 mg | ORAL_TABLET | Freq: Every day | ORAL | 0 refills | Status: AC
  Filled 2024-02-16: qty 30, 30d supply, fill #0

## 2024-02-16 MED ORDER — DICLOFENAC SODIUM 1 % EX GEL
2.0000 g | Freq: Four times a day (QID) | CUTANEOUS | Status: DC
  Administered 2024-02-16 – 2024-02-17 (×3): 2 g via TOPICAL
  Filled 2024-02-16: qty 100

## 2024-02-16 MED ORDER — APIXABAN 5 MG PO TABS
5.0000 mg | ORAL_TABLET | Freq: Two times a day (BID) | ORAL | 0 refills | Status: DC
  Filled 2024-02-16: qty 60, 30d supply, fill #0

## 2024-02-16 MED ORDER — DICLOFENAC SODIUM 1 % EX GEL: 2.0000 g | Freq: Four times a day (QID) | CUTANEOUS | Status: DC

## 2024-02-16 MED ORDER — CYCLOBENZAPRINE HCL 10 MG PO TABS
10.0000 mg | ORAL_TABLET | Freq: Three times a day (TID) | ORAL | 0 refills | Status: AC | PRN
  Filled 2024-02-16: qty 30, 10d supply, fill #0

## 2024-02-16 MED ORDER — TRAZODONE HCL 50 MG PO TABS
25.0000 mg | ORAL_TABLET | Freq: Every evening | ORAL | 0 refills | Status: AC | PRN
  Filled 2024-02-16: qty 30, 30d supply, fill #0

## 2024-02-16 MED ORDER — ATORVASTATIN CALCIUM 40 MG PO TABS
40.0000 mg | ORAL_TABLET | Freq: Every day | ORAL | 0 refills | Status: DC
  Filled 2024-02-16: qty 30, 30d supply, fill #0

## 2024-02-16 MED ORDER — NICOTINE 14 MG/24HR TD PT24
14.0000 mg | MEDICATED_PATCH | Freq: Every day | TRANSDERMAL | 0 refills | Status: AC
  Filled 2024-02-16: qty 28, 28d supply, fill #0

## 2024-02-16 MED ORDER — NICOTINE 14 MG/24HR TD PT24
14.0000 mg | MEDICATED_PATCH | Freq: Every day | TRANSDERMAL | Status: DC
  Administered 2024-02-17: 08:00:00 14 mg via TRANSDERMAL
  Filled 2024-02-16: qty 1

## 2024-02-16 MED ORDER — METOPROLOL TARTRATE 25 MG PO TABS
12.5000 mg | ORAL_TABLET | Freq: Two times a day (BID) | ORAL | 0 refills | Status: DC
  Filled 2024-02-16: qty 30, 30d supply, fill #0

## 2024-02-16 MED ORDER — ACETAMINOPHEN 325 MG PO TABS: 650.0000 mg | ORAL_TABLET | ORAL | Status: DC | PRN

## 2024-02-16 MED ORDER — MAGNESIUM OXIDE -MG SUPPLEMENT 400 (240 MG) MG PO TABS
400.0000 mg | ORAL_TABLET | Freq: Every day | ORAL | Status: DC
  Administered 2024-02-17: 08:00:00 400 mg via ORAL
  Filled 2024-02-16: qty 1

## 2024-02-16 NOTE — Progress Notes (Signed)
 Physical Therapy Session Note  Patient Details  Name: Jonathon Harper MRN: [id number] Date of Birth: Mar 06, 1955  Today's Date: 02/16/2024 PT Individual Time: 8454-8345 PT Individual Time Calculation (min): 69 min   Short Term Goals: Week 1:  PT Short Term Goal 1 (Week 1): STG = LTG d/t ELOS  Skilled Therapeutic Interventions/Progress Updates:      Pt presents sleeping in bed - awakens to voice and in agreement to therapy treatment. Pt reports no pain, is wearing his R knee neoprene sleeve which he reports provides him much relief. Pt aware of DC plan, has no questions or concerns re: plan.   Bed mobility completed independently. Able to don his shoes while he sat EOB. Sit<>stand with no AD independently from bed. Pt ambulates independently without an AD from his room to ortho gym, ~100'. Completed car transfer and navigating unlevel ramp independently, car height set to his personal vehicle.   Once in the main gym, completed stair training with 6 steps and 2 hand rails. Completed 2x12 with seated rest breaks. Completed mod I with a alternating pattern for both ascent and descent.   Pt reporting urgent need to void so he ambulated to public restroom and navigated this space without difficulty.   Pt instructed in balance training with Biodex. Completed without UE support, static Limits of Stability Training - score = 8%. Pt with most difficulty with A/P weight shifting on moderate difficulty.   Pt completed Nustep with BUE/BLE at L3 resistance for 15 minutes. Cues for full AROM bilaterally, encouraging AROM for his R knee. Pt achieved a total of 885 steps.   Pt returned to his room and ended session seated in recliner. Pt has been made independent in the room, pt with no needs at end.    Therapy Documentation Precautions:  Precautions Precautions: Fall Recall of Precautions/Restrictions: Intact Restrictions Weight Bearing Restrictions Per Provider Order: No General:        Therapy/Group: Individual Therapy  Sherlean SHAUNNA Perks 02/16/2024, 8:31 AM

## 2024-02-16 NOTE — Progress Notes (Signed)
 Occupational Therapy Discharge Summary  Patient Details  Name: Jonathon Harper MRN: [id number] Date of Birth: December 29, 1955  Date of Discharge from OT service:February 16, 2024    Patient has met 9 of 9 long term goals due to improved balance, ability to compensate for deficits, and functional use of  RIGHT lower extremity.  Patient to discharge at overall Modified Independent level.  Patient's care partner is independent to provide the necessary physical assistance at discharge.    Reasons goals not met: NA  Recommendation:  No further OT warranted  Equipment: No equipment provided  Reasons for discharge: treatment goals met and discharge from hospital  Patient/family agrees with progress made and goals achieved: Yes  OT Discharge Precautions/Restrictions  Precautions Precautions: Fall Recall of Precautions/Restrictions: Intact Restrictions Weight Bearing Restrictions Per Provider Order: No   Pain Pain Assessment Pain Score: 7  Pain Type: Chronic pain Pain Location: Knee Pain Orientation: Right Pain Descriptors / Indicators: Aching Pain Onset: On-going Patients Stated Pain Goal: 0 Pain Intervention(s): Medication (See eMAR);Other (Comment) (knee sleeve) ADL ADL Eating: Independent Where Assessed-Eating: Other (comment) Grooming: Independent Where Assessed-Grooming: Standing at sink Upper Body Bathing: Independent Where Assessed-Upper Body Bathing: Shower Lower Body Bathing: Independent Where Assessed-Lower Body Bathing: Shower Upper Body Dressing: Independent Where Assessed-Upper Body Dressing: Standing at sink, Sitting at sink Lower Body Dressing: Independent Where Assessed-Lower Body Dressing: Standing at sink, Sitting at sink Toileting: Independent Where Assessed-Toileting: Teacher, Adult Education: Community Education Officer Method: Proofreader: Chiropractor Transfer: Modified independent Web Designer Method:  Ship Broker: Acupuncturist: Psychologist, Counselling Method: Designer, Industrial/product: Engineer, materials Vision Baseline Vision/History: 0 No visual deficits Patient Visual Report: No change from baseline Vision Assessment?: No apparent visual deficits Perception  Perception: Within Functional Limits Praxis Praxis: WFL Cognition Cognition Overall Cognitive Status: Within Functional Limits for tasks assessed Arousal/Alertness: Awake/alert Orientation Level: Person;Place;Situation Person: Oriented Place: Oriented Situation: Oriented Memory: Appears intact Attention: Selective;Alternating Sustained Attention: Appears intact Selective Attention: Appears intact Awareness: Appears intact Problem Solving: Appears intact Safety/Judgment: Appears intact Brief Interview for Mental Status (BIMS) Repetition of Three Words (First Attempt): 3 Temporal Orientation: Year: Correct Temporal Orientation: Month: Accurate within 5 days Temporal Orientation: Day: Correct Recall: Sock: Yes, no cue required Recall: Blue: Yes, no cue required Recall: Bed: Yes, no cue required BIMS Summary Score: 15 Sensation Sensation Light Touch: Appears Intact Hot/Cold: Appears Intact Proprioception: Appears Intact Stereognosis: Not tested Coordination Gross Motor Movements are Fluid and Coordinated: No Fine Motor Movements are Fluid and Coordinated: Yes Coordination and Movement Description: Balance deficit d/t R knee pain Finger Nose Finger Test: Smooth equal movements bilaterally 9 Hole Peg Test: 9HP: R-28.42 L-29.9 Motor  Motor Motor: Within Functional Limits Motor - Skilled Clinical Observations: Balance deficit d/t R knee pain Motor - Discharge Observations: Balance deficit d/t R knee pain Mobility  Bed Mobility Bed Mobility: Supine to Sit;Sit to Supine Supine to Sit: Independent Sit to Supine: Independent Transfers Sit to Stand:  Independent Stand to Sit: Independent  Trunk/Postural Assessment  Cervical Assessment Cervical Assessment: Within Functional Limits Thoracic Assessment Thoracic Assessment: Within Functional Limits Lumbar Assessment Lumbar Assessment: Within Functional Limits Postural Control Postural Control: Within Functional Limits  Balance Balance Balance Assessed: Yes Static Sitting Balance Static Sitting - Balance Support: Feet supported Static Sitting - Level of Assistance: 7: Independent Dynamic Sitting Balance Dynamic Sitting - Balance Support: Feet supported;During functional activity Dynamic Sitting - Level of Assistance: 7: Independent Static Standing  Balance Static Standing - Balance Support: During functional activity;No upper extremity supported Static Standing - Level of Assistance: 7: Independent Dynamic Standing Balance Dynamic Standing - Balance Support: Right upper extremity supported;Left upper extremity supported;During functional activity Dynamic Standing - Level of Assistance: 6: Modified independent (Device/Increase time) Extremity/Trunk Assessment RUE Assessment RUE Assessment: Within Functional Limits LUE Assessment LUE Assessment: Within Functional Limits   Fransico Allean HERO 02/16/2024, 12:27 PM

## 2024-02-16 NOTE — Plan of Care (Signed)
°  Problem: Consults Goal: RH STROKE PATIENT EDUCATION Description: See Patient Education module for education specifics  Outcome: Progressing   Problem: RH SAFETY Goal: RH STG ADHERE TO SAFETY PRECAUTIONS W/ASSISTANCE/DEVICE Description: STG Adhere to Safety Precautions With cues Assistance/Device. Outcome: Progressing   Problem: RH KNOWLEDGE DEFICIT Goal: RH STG INCREASE KNOWLEDGE OF DIABETES Description: Patient and S. O. Will be able to manage DM using educational resources for medications and dietary modification independently Outcome: Progressing Goal: RH STG INCREASE KNOWLEDGE OF HYPERTENSION Description: Patient and S. O. Will be able to manage HTN using educational resources for medications and dietary modification independently Outcome: Progressing Goal: RH STG INCREASE KNOWLEGDE OF HYPERLIPIDEMIA Description: Patient and S. O. 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 O. Will be able to manage secondary risks using educational resources for medications and dietary modification independently Outcome: Progressing

## 2024-02-16 NOTE — Progress Notes (Signed)
 PROGRESS NOTE   Subjective/Complaints: No new complaints this morning, continues to have right knee pain especially when he walks long distances, has not tried voltaren  gel  ROS: +right knee pain and swelling   Objective:   DG Knee 1-2 Views Right Result Date: 02/15/2024 CLINICAL DATA:  Right knee pain. EXAM: RIGHT KNEE - 1-2 VIEW COMPARISON:  11/01/2022 and 01/18/2024 FINDINGS: There is moderate osteoarthritic change of the patellofemoral joint and lateral compartment. No acute fracture or dislocation. 4.4 cm oval os ossific density within the soft tissues lateral to the distal femoral diaphysis without significant change from the recent prior exam although slightly larger compared to 2024. This may be within the lateral aspect of the suprapatellar joint space. Remainder the exam is unchanged. IMPRESSION: 1. No acute findings. 2. 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 3. 4.4 cm oval ossific density within the soft tissues lateral to the distal femoral diaphysis without significant change from the recent prior exam, although slightly larger compared to 2024. This may be within the lateral aspect of the suprapatellar joint space. Electronically Signed   By: Toribio Agreste M.D.   On: 02/15/2024 16:17   Recent Labs    02/15/24 0525  WBC 6.8  HGB 13.8  HCT 43.4  PLT 292   Recent Labs    02/15/24 0525  NA 135  K 4.1  CL 103  CO2 27  GLUCOSE 102*  BUN 17  CREATININE 1.19  CALCIUM  9.0    Intake/Output Summary (Last 24 hours) at 02/16/2024 0902 Last data filed at 02/16/2024 0741 Gross per 24 hour  Intake 260 ml  Output --  Net 260 ml        Physical Exam: Vital Signs Blood pressure (!) 137/90, pulse 63, temperature 98 F (36.7 C), resp. rate 20, height 6' 2 (1.88 m), weight 111.4 kg, SpO2 94%. Gen: no distress, normal appearing HEENT: oral mucosa pink and moist,  NCAT Cardio: Reg rate Chest: normal effort, normal rate of breathing Abd: soft, non-distended Ext: no edema Psych: pleasant, normal affect Skin: intact Neuro: AOx3 Musculoskeletal: 5/5 except for RLE knee extension which is limited by knee pain. +swelling of right knee  Assessment/Plan: 1. Functional deficits which require 3+ hours per day of interdisciplinary therapy in a comprehensive inpatient rehab setting. Physiatrist is providing close team supervision and 24 hour management of active medical problems listed below. Physiatrist and rehab team continue to assess barriers to discharge/monitor patient progress toward functional and medical goals  Care Tool:  Bathing    Body parts bathed by patient: Right arm, Left arm, Chest, Front perineal area, Abdomen, Buttocks, Right upper leg, Right lower leg, Left upper leg, Left lower leg, Face         Bathing assist Assist Level: Supervision/Verbal cueing     Upper Body Dressing/Undressing Upper body dressing   What is the patient wearing?: Pull over shirt    Upper body assist Assist Level: Supervision/Verbal cueing    Lower Body Dressing/Undressing Lower body dressing      What is the patient wearing?: Pants, Underwear/pull up     Lower body assist Assist for lower body dressing: Contact Guard/Touching  assist     Toileting Toileting    Toileting assist Assist for toileting: Supervision/Verbal cueing     Transfers Chair/bed transfer  Transfers assist     Chair/bed transfer assist level: Contact Guard/Touching assist     Locomotion Ambulation   Ambulation assist      Assist level: Contact Guard/Touching assist Assistive device: No Device Max distance: 1200 ft   Walk 10 feet activity   Assist     Assist level: Independent with assistive device Assistive device: Cane-straight   Walk 50 feet activity   Assist    Assist level: Contact Guard/Touching assist Assistive device: No Device    Walk  150 feet activity   Assist    Assist level: Contact Guard/Touching assist Assistive device: No Device    Walk 10 feet on uneven surface  activity   Assist Walk 10 feet on uneven surfaces activity did not occur: Safety/medical concerns (ran out of time to attempt)         Wheelchair     Assist Is the patient using a wheelchair?: No (pt does not need and will not use on d/c d/t high LOF)             Wheelchair 50 feet with 2 turns activity    Assist            Wheelchair 150 feet activity     Assist          Blood pressure (!) 137/90, pulse 63, temperature 98 F (36.7 C), resp. rate 20, height 6' 2 (1.88 m), weight 111.4 kg, SpO2 94%.  Medical Problem List and Plan: 1. 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 If thrombus in right VA resolved will change to antiplatelet agent   -This patient is capable of making decisions on his own behalf.   2. Right knee pain: voltaren  gel scheduled   3. Hypertension: Resumed home amlodipine  5 mg daily and metoprolol  25 mg bid. Add magnesium  supplement daily-increase to 400mg  daily  4. Constipation: magnesium  oxide ordered daily- increase to 400mg  daily   5.Nonsustained VT Tachycardia: 12/9--6 beat run of VT. Replaced K+ to target. Follow up Labs CMP in a.m.  -Monitor HR per protocol while on metoprolol  25 mg bid.               6. CKD IIIa: Creatinine at baseline, monitor labs.    7. HLD: LDL 100, continue Lipitor 40 mg daily    8. Tobacco Abuse: Educate on cessation. Decrease nicotine  patch to 14mcg given HTN   9. Cocaine/THC abuse: counseled on cessation.   LOS: 2 days A FACE TO FACE EVALUATION WAS PERFORMED  Deshondra Worst  P Shaterria Sager 02/16/2024, 9:02 AM

## 2024-02-16 NOTE — Progress Notes (Signed)
 Occupational Therapy Session Note  Patient Details  Name: Jonathon Harper MRN: [id number] Date of Birth: [birth year]  Today's Date: 02/16/2024 OT Individual Time: 1000-1113 OT Individual Time Calculation (min): 73 min    Short Term Goals: Week 1:  OT Short Term Goal 1 (Week 1): STG=LTG d/t ELOS  Skilled Therapeutic Interventions/Progress Updates:    Pt received sitting up with no c/o pain, agreeable to OT session. He completed functional mobility to the therapy gym with no device, mod I. He completed 2 trials of 300 ft of functional mobility with a weighted bilateral carry, 10 lb dumbbells. Carryover to improving functional activity tolerance for higher level IADLs and dynamic balance. Pt required use of rest breaks throughout session for recovery, as well as to support safety and prevent overexertion. During breaks, OT monitored recovery time to assess endurance and response to exertion. He then completed combo UE exercise bicep curl into shoulder flexion 3x10 repetitions. Completed to challenge BUE strength and endurance needed to complete ADLs and IADLs with the highest level of independence. He had one LOB during standing UE with min A to correct. He then completed dynamic stepping in/out tandem stance to challenge postural control and dynamic balance for reduced fall risk at home. He required CGA throughout. He returned to his room following. He was left sitting up with all needs met.   Therapy Documentation Precautions:  Precautions Precautions: Fall Recall of Precautions/Restrictions: Intact Restrictions Weight Bearing Restrictions Per Provider Order: No Therapy/Group: Individual Therapy  Nena VEAR Moats 02/16/2024, 8:59 AM

## 2024-02-16 NOTE — Progress Notes (Signed)
 Physical Therapy Discharge Summary  Patient Details  Name: Jonathon Harper MRN: [id number] Date of Birth: 08/21/1955  Date of Discharge from PT service:February 16, 2024   Patient has met 7 of 7 long term goals due to improved activity tolerance, improved balance, increased strength, improved attention, and improved awareness.  Patient to discharge at an ambulatory level Independent.   Patient's care partner is independent to provide the necessary physical assistance at discharge.  Reasons goals not met: n/a  Recommendation:  Patient will benefit from ongoing skilled PT services in outpatient setting to continue to advance safe functional mobility, address ongoing impairments in generalized weakness and endurance deficits, and minimize fall risk.  Equipment:  No equipment provided, none warranted.   Reasons for discharge: treatment goals met and discharge from hospital  Patient/family agrees with progress made and goals achieved: Yes  PT Discharge Precautions/Restrictions Precautions Precautions: Fall Recall of Precautions/Restrictions: Intact Restrictions Weight Bearing Restrictions Per Provider Order: No Pain Interference Pain Interference Pain Effect on Sleep: 0. Does not apply - I have not had any pain or hurting in the past 5 days Pain Interference with Therapy Activities: 0. Does not apply - I have not received rehabilitationtherapy in the past 5 days Pain Interference with Day-to-Day Activities: 1. Rarely or not at all Vision/Perception  Vision - History Ability to See in Adequate Light: 0 Adequate Perception Perception: Within Functional Limits Praxis Praxis: WFL  Cognition Overall Cognitive Status: Within Functional Limits for tasks assessed Arousal/Alertness: Awake/alert Orientation Level: Oriented X4 Attention: Selective;Alternating Sustained Attention: Appears intact Selective Attention: Appears intact Alternating Attention: Appears intact Memory: Appears  intact Awareness: Appears intact Problem Solving: Appears intact Safety/Judgment: Appears intact Sensation Sensation Light Touch: Appears Intact Hot/Cold: Appears Intact Proprioception: Appears Intact Stereognosis: Not tested Coordination Gross Motor Movements are Fluid and Coordinated: No Fine Motor Movements are Fluid and Coordinated: Yes Coordination and Movement Description: Balance deficit d/t R knee pain Finger Nose Finger Test: Smooth equal movements bilaterally Heel Shin Test: [facility] 9 Hole Peg Test: 9HP: R-28.42 L-29.9 Motor  Motor Motor: Within Functional Limits Motor - Skilled Clinical Observations: Balance deficit d/t R knee pain Motor - Discharge Observations: Balance deficit d/t R knee pain  Mobility Bed Mobility Bed Mobility: Supine to Sit;Sit to Supine Supine to Sit: Independent Sit to Supine: Independent Transfers Transfers: Sit to Stand;Stand to [employer] Transfers Sit to Stand: Independent Stand to Sit: Independent Stand Pivot Transfers: Independent Transfer (Assistive device): None Locomotion  Gait Ambulation: Yes Gait Assistance: Independent Gait Distance (Feet): 500 Feet Assistive device: None Gait Gait: Yes Gait Pattern: Within Functional Limits Stairs / Additional Locomotion Stairs: Yes Stairs Assistance: Independent with assistive device Stair Management Technique: Two rails Number of Stairs: 12 Height of Stairs: 6 Pick up small object from the floor assist level: Independent Wheelchair Mobility Wheelchair Mobility: No  Trunk/Postural Assessment  Cervical Assessment Cervical Assessment: Within Functional Limits Thoracic Assessment Thoracic Assessment: Within Functional Limits Lumbar Assessment Lumbar Assessment: Within Functional Limits Postural Control Postural Control: Within Functional Limits Protective Responses: mildly slowed  Balance Balance Balance Assessed: Yes Static Sitting Balance Static Sitting - Balance Support:  Feet supported Static Sitting - Level of Assistance: 7: Independent Dynamic Sitting Balance Dynamic Sitting - Balance Support: Feet supported;During functional activity Dynamic Sitting - Level of Assistance: 7: Independent Static Standing Balance Static Standing - Balance Support: During functional activity;No upper extremity supported Static Standing - Level of Assistance: 7: Independent Dynamic Standing Balance Dynamic Standing - Balance Support: Right upper extremity supported;Left upper  extremity supported;During functional activity Dynamic Standing - Level of Assistance: 6: Modified independent (Device/Increase time) Extremity Assessment  RUE Assessment RUE Assessment: Within Functional Limits LUE Assessment LUE Assessment: Within Functional Limits RLE Assessment RLE Assessment: Within Functional Limits LLE Assessment LLE Assessment: Within Functional Limits   Ralphine Hinks P Cristiana Yochim 02/16/2024, 3:53 PM

## 2024-02-16 NOTE — Progress Notes (Signed)
 Physical Therapy Note  Patient Details  Name: Jonathon Harper MRN: [id number] Date of Birth: 10/12/1955 Today's Date: 02/16/2024    Physical Therapist participated in the interdisciplinary team conference, providing clinical information regarding the patient's current status, treatment goals, and weekly focus, including any barriers that need to be addressed. Please see the Inpatient Rehabilitation Team Conference and Plan of Care Update for further details.    [name] [name] 02/16/2024, 9:47 AM

## 2024-02-16 NOTE — Plan of Care (Signed)
°  Problem: RH Balance Goal: LTG Patient will maintain dynamic standing balance (PT) Description: LTG:  Patient will maintain dynamic standing balance with assistance during mobility activities (PT) Flowsheets (Taken 02/16/2024 0711) LTG: Pt will maintain dynamic standing balance during mobility activities with:: Independent with assistive device    Problem: Sit to Stand Goal: LTG:  Patient will perform sit to stand with assistance level (PT) Description: LTG:  Patient will perform sit to stand with assistance level (PT) Flowsheets (Taken 02/16/2024 0711) LTG: PT will perform sit to stand in preparation for functional mobility with assistance level: Independent   Problem: RH Bed to Chair Transfers Goal: LTG Patient will perform bed/chair transfers w/assist (PT) Description: LTG: Patient will perform bed to chair transfers with assistance (PT). Flowsheets (Taken 02/16/2024 0711) LTG: Pt will perform Bed to Chair Transfers with assistance level: Independent   Problem: RH Car Transfers Goal: LTG Patient will perform car transfers with assist (PT) Description: LTG: Patient will perform car transfers with assistance (PT). Flowsheets (Taken 02/16/2024 0711) LTG: Pt will perform car transfers with assist:: Independent   Problem: RH Furniture Transfers Goal: LTG Patient will perform furniture transfers w/assist (OT/PT) Description: LTG: Patient will perform furniture transfers  with assistance (OT/PT). Flowsheets (Taken 02/16/2024 0711) LTG: Pt will perform furniture transfers with assist:: Independent   Problem: RH Ambulation Goal: LTG Patient will ambulate in home environment (PT) Description: LTG: Patient will ambulate in home environment, # of feet with assistance (PT). Flowsheets (Taken 02/16/2024 0711) LTG: Pt will ambulate in home environ  assist needed:: Independent LTG: Ambulation distance in home environment: up to 50 ft per bout Goal: LTG Patient will ambulate in community  environment (PT) Description: LTG: Patient will ambulate in community environment, # of feet with assistance (PT). Flowsheets (Taken 02/16/2024 0711) LTG: Pt will ambulate in community environ  assist needed:: Supervision/Verbal cueing LTG: Ambulation distance in community environment: more than 1200 ft using LRAD   Problem: RH Stairs Goal: LTG Patient will ambulate up and down stairs w/assist (PT) Description: LTG: Patient will ambulate up and down # of stairs with assistance (PT) Flowsheets (Taken 02/16/2024 0711) LTG: Pt will ambulate up/down stairs assist needed:: Independent with assistive device LTG: Pt will  ambulate up and down number of stairs: at least 2 flights of steps using BHR

## 2024-02-16 NOTE — Progress Notes (Signed)
 Orthopedic Tech Progress Note Patient Details:  Jonathon Harper 1955-08-30 [id number]  Ortho Devices Type of Ortho Device: Knee Sleeve Ortho Device/Splint Location: RLE Ortho Device/Splint Interventions: Ordered, Application, Adjustment   Post Interventions Patient Tolerated: Well Instructions Provided: Care of device, Adjustment of device  Jonathon Harper 02/16/2024, 9:36 AM

## 2024-02-16 NOTE — Progress Notes (Signed)
 Occupational Therapy Note  Patient Details  Name: Jonathon Harper MRN: [id number] Date of Birth: [birth year]   Occupational Therapist participated in the interdisciplinary team conference, providing clinical information regarding the patient's current status, treatment goals, and weekly focus, including any barriers that need to be addressed. Please see the Inpatient Rehabilitation Team Conference and Plan of Care Update for further details.    Jonathon Harper 02/16/2024, 11:12 AM

## 2024-02-16 NOTE — Patient Care Conference (Signed)
 Inpatient RehabilitationTeam Conference and Plan of Care Update Date: 02/16/2024   Time: 11:01 AM    Patient Name: Jonathon Harper      Medical Record Number: [id number]  Date of Birth: 1955/03/16 Sex: Male         Room/Bed: 4M07C/4M07C-01 Payor Info: Payor: [employer] / Plan: DREMA DUAL COMPLETE / Product Type: *No Product type* /    Admit Date/Time:  02/14/2024  2:21 PM  Primary Diagnosis:  CVA (cerebral vascular accident) [facility])  Hospital Problems: Principal Problem:   CVA (cerebral vascular accident) [facility])    Expected Discharge Date: Expected Discharge Date: 02/17/24  Team Members Present: Physician leading conference: Dr. Sven Elks Social Worker Present: Graeme Jude, LCSW Nurse Present: Barnie Ronde, RN PT Present: Sherlean Perks, PT OT Present: Monica Peacock, OT SLP Present: Recardo Mole, SLP PPS Coordinator present : Eleanor Colon, SLP     Current Status/Progress Goal Weekly Team Focus  Bowel/Bladder   Continent of bowel and bladder   maintaine continence   maintaine current bowel and bladder patterns    Swallow/Nutrition/ Hydration               ADL's   He is doing very well. Set-up UB ADLs; SUP-CGA LB ADLs, toileting, and transfers without AD // Barriers: R knee pain, mild R LE weakness   MOD I   Evaluation, ADL retraining, Pt education, dynamic balance    Mobility   supervision   mod I  DC planning, pt education, dynamic balance    Communication                Safety/Cognition/ Behavioral Observations               Pain   dneis pain   Pain will be managed below 3/10   continued pain assesment q shift    Skin   intant   skin will remain  intact  q shift skin assessment      Discharge Planning:  Plans to discharge home with his s/o [name]. Awaiting therapy follow-up recommendations. No DME needs at present. Letter for complex to be signed and providng prior to discharge.   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 Progress limited right knee pain; favors right leg.  Patient on target to meet rehab goals: yes, currently doing well overall; able to reach item from floor without loss of balance. Able to ambulate up to 1200' in a 6-min walk test and needs supervision to navigate 20 steps.   *See Care Plan and progress notes for long and short-term goals.   Revisions to Treatment Plan:  Knee sleeve/Voltaren  gel added  Xray knee; OA and cyst   Teaching Needs: Safety, medications, transfers, toileting, etc.   Current Barriers to Discharge: Decreased caregiver support and Home enviroment access/layout  Possible Resolutions to Barriers: Family education F/U OP for fluid aspiration and steroid injection OP follow up services     Medical Summary Current Status: HTN, right knee OA and cyst, class 1 obesity, HLD  Barriers to Discharge: Medical stability  Barriers to Discharge Comments: HTN, right knee OA and cyst, class 1 obesity, HLD Possible Resolutions to [employer] Focus: decrease nicotine  patch, continue amlodipine , continue monitoring BP TID, knee sleeve ordered, voltaren  gel scheduled, provided dietary education, continue statin, continue magnesium  supplement   Continued Need for Acute Rehabilitation Level of Care: The patient requires daily medical management by a physician with specialized training in physical medicine and  rehabilitation for the following reasons: Direction of a multidisciplinary physical rehabilitation program to maximize functional independence : Yes Medical management of patient stability for increased activity during participation in an intensive rehabilitation regime.: Yes Analysis of laboratory values and/or radiology reports with any subsequent need for medication adjustment and/or medical intervention. : Yes   I attest that I was present, lead the team conference, and  concur with the assessment and plan of the team.   Fredericka Sober B 02/16/2024, 1:40 PM

## 2024-02-16 NOTE — Progress Notes (Signed)
 Patient ID: Jonathon Harper, male   DOB: 01-18-1956, 68 y.o.   MRN: [id number]  This SW covering for primary SW, Jonathon Harper.   SW met with pt in room to provide updates from team conference,and inform on d/c date changes from 12/19 to 12/18. He states he will have to leave after 1pm due to his s/o getting off work at [employer]. No questions/concerns reported.   Graeme Jude, MSW, LCSW Office: [phone] Cell: [phone] Fax: [phone]

## 2024-02-16 NOTE — Discharge Summary (Signed)
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 mg total) by mouth daily. Start taking on: February 17, 2024   metoprolol  tartrate 25 MG tablet Commonly known as: LOPRESSOR  Take 0.5 tablets (12.5 mg total) by mouth 2 (two) times daily.   nicotine  14 mg/24hr patch Commonly known as: NICODERM CQ  - dosed in mg/24 hours Place 1 patch (14 mg total) onto the skin daily. Start taking on: February 17, 2024 Replaces: nicotine  21 mg/24hr patch   traZODone  50 MG tablet Commonly known as: DESYREL  Take 0.5-1 tablets (25-50 mg total) by mouth at bedtime as needed for sleep.        Follow-up Information     Campbell Reynolds, NP Follow up.   Why: Call for an appointment--hospital follow up within 1-2 weeks. Contact information: [address] [place] [address] [phone]         Lorilee Sven SQUIBB, MD Follow up.   Specialty: Physical Medicine and Rehabilitation Why: Office will call for appointment Contact information: [address] [place] [address] [phone]         [facility] Follow up.   Specialty: Neurology Why: Call for an appointment. Contact information: [address] [place]  [address] [phone]        Joane Artist RAMAN, MD Follow up.   Specialties: Family Medicine, Emergency Medicine, Sports Medicine Why: Follow up for right knee pain. Contact information: [address] [place] [address] [phone]                 Signed: Daphne LOISE Satterfield 02/16/2024, 2:02 PM

## 2024-02-16 NOTE — Progress Notes (Signed)
 Inpatient Rehabilitation Discharge Medication Review by a Pharmacist  A complete drug regimen review was completed for this patient to identify any potential clinically significant medication issues.  High Risk Drug Classes Is patient taking? Indication by Medication  Antipsychotic No   Anticoagulant Yes Apixaban - Right AV thrombus  Antibiotic No   Opioid No   Antiplatelet No   Hypoglycemics/insulin No   Vasoactive Medication Yes Norvasc , lopressor  HTN  Chemotherapy No   Other Yes Lipitor- HLD Flexeril - muscle spasms Trazodone - sleep     Type of Medication Issue Identified Description of Issue Recommendation(s)  Drug Interaction(s) (clinically significant)     Duplicate Therapy     Allergy     No Medication Administration End Date     Incorrect Dose     Additional Drug Therapy Needed     Significant med changes from prior encounter (inform family/care partners about these prior to discharge).    Other       Clinically significant medication issues were identified that warrant physician communication and completion of prescribed/recommended actions by midnight of the next day:  No   Time spent performing this drug regimen review (minutes):  30   Cyndi Montejano BS, PharmD, BCPS Clinical Pharmacist 02/16/2024 11:11 AM  Contact: [phone] after 3 PM

## 2024-02-16 NOTE — Progress Notes (Signed)
 Occupational Therapy Session Note  Patient Details  Name: Jonathon Harper MRN: [id number] Date of Birth: 02-23-1956  Today's Date: 02/16/2024 OT Individual Time: 9150-9054 OT Individual Time Calculation (min): 56 min    Short Term Goals: Week 1:  OT Short Term Goal 1 (Week 1): STG=LTG d/t ELOS  Skilled Therapeutic Interventions/Progress Updates:   Patient received awake and alert.  Patient with report of knee pain right - knee swollen, lateral cyst evident.  Patient has been managing this for years with ortho.  Patient walking in room without assistance and without device.  Spoke with MD regarding knee pain - MD ordered Voltaren  gel and knee brace.   Patient gathered his supplies, and showered standing.  Patient able to reach to floor without loss of balance.  Dressed himself without assistance.  Knee brace arrived and educated patient on donning and wearing schedule.  Patient feels immediate reduction in pain with knee sleeve.  Patient with minor compensatory strategies in walking to favor right knee.  Has cane here which he used occasionally at home for support R knee.   Left up in chair at end of session.  Patient eager to get home.  Made independent in room today.    Therapy Documentation Precautions:  Precautions Precautions: Fall Recall of Precautions/Restrictions: Intact Restrictions Weight Bearing Restrictions Per Provider Order: No   Pain: Pain Assessment Pain Scale: 0-10 Pain Score: 0-No pain7/10 Right knee swollen - spoke with MD who ordered Voltaren     Therapy/Group: Individual Therapy  Adeana Grilliot M 02/16/2024, 10:01 AM

## 2024-02-17 ENCOUNTER — Other Ambulatory Visit (HOSPITAL_COMMUNITY): Payer: Self-pay

## 2024-02-17 DIAGNOSIS — I63011 Cerebral infarction due to thrombosis of right vertebral artery: Secondary | ICD-10-CM | POA: Diagnosis not present

## 2024-02-17 LAB — CBC WITH DIFFERENTIAL/PLATELET
Abs Immature Granulocytes: 0.01 K/uL (ref 0.00–0.07)
Basophils Absolute: 0.1 K/uL (ref 0.0–0.1)
Basophils Relative: 1 %
Eosinophils Absolute: 0.3 K/uL (ref 0.0–0.5)
Eosinophils Relative: 5 %
HCT: 40.6 % (ref 39.0–52.0)
Hemoglobin: 13 g/dL (ref 13.0–17.0)
Immature Granulocytes: 0 %
Lymphocytes Relative: 40 %
Lymphs Abs: 2.4 K/uL (ref 0.7–4.0)
MCH: 26.7 pg (ref 26.0–34.0)
MCHC: 32 g/dL (ref 30.0–36.0)
MCV: 83.5 fL (ref 80.0–100.0)
Monocytes Absolute: 0.7 K/uL (ref 0.1–1.0)
Monocytes Relative: 11 %
Neutro Abs: 2.6 K/uL (ref 1.7–7.7)
Neutrophils Relative %: 43 %
Platelets: 275 K/uL (ref 150–400)
RBC: 4.86 MIL/uL (ref 4.22–5.81)
RDW: 14.5 % (ref 11.5–15.5)
WBC: 6 K/uL (ref 4.0–10.5)
nRBC: 0 % (ref 0.0–0.2)

## 2024-02-17 LAB — BASIC METABOLIC PANEL WITH GFR
Anion gap: 9 (ref 5–15)
BUN: 21 mg/dL (ref 8–23)
CO2: 26 mmol/L (ref 22–32)
Calcium: 9.5 mg/dL (ref 8.9–10.3)
Chloride: 103 mmol/L (ref 98–111)
Creatinine, Ser: 1.19 mg/dL (ref 0.61–1.24)
GFR, Estimated: >60 mL/min (ref >60)
Glucose, Bld: 102 mg/dL — ABNORMAL HIGH (ref 70–99)
Potassium: 4.5 mmol/L (ref 3.5–5.1)
Sodium: 137 mmol/L (ref 135–145)

## 2024-02-17 NOTE — Progress Notes (Signed)
 PROGRESS NOTE   Subjective/Complaints: No new complaints this morning Discussed that we would provide list of patient's medications and follow-ups prior to d/c today  ROS: +right knee pain and swelling- improved with knee sleeve   Objective:   DG Knee 1-2 Views Right Result Date: 02/15/2024 CLINICAL DATA:  Right knee pain. EXAM: RIGHT KNEE - 1-2 VIEW COMPARISON:  11/01/2022 and 01/18/2024 FINDINGS: There is moderate osteoarthritic change of the patellofemoral joint and lateral compartment. No acute fracture or dislocation. 4.4 cm oval os ossific density within the soft tissues lateral to the distal femoral diaphysis without significant change from the recent prior exam although slightly larger compared to 2024. This may be within the lateral aspect of the suprapatellar joint space. Remainder the exam is unchanged. IMPRESSION: 1. No acute findings. 2. 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 3. 4.4 cm oval ossific density within the soft tissues lateral to the distal femoral diaphysis without significant change from the recent prior exam, although slightly larger compared to 2024. This may be within the lateral aspect of the suprapatellar joint space. Electronically Signed   By: Toribio Agreste M.D.   On: 02/15/2024 16:17   Recent Labs    02/15/24 0525 02/17/24 0504  WBC 6.8 6.0  HGB 13.8 13.0  HCT 43.4 40.6  PLT 292 275   Recent Labs    02/15/24 0525 02/17/24 0504  NA 135 137  K 4.1 4.5  CL 103 103  CO2 27 26  GLUCOSE 102* 102*  BUN 17 21  CREATININE 1.19 1.19  CALCIUM  9.0 9.5    Intake/Output Summary (Last 24 hours) at 02/17/2024 0942 Last data filed at 02/17/2024 0800 Gross per 24 hour  Intake 1416 ml  Output --  Net 1416 ml        Physical Exam: Vital Signs Blood pressure (!) 130/92, pulse 81, temperature 98.2 F (36.8 C), temperature source Oral, resp. rate 16,  height 6' 2 (1.88 m), weight 111.4 kg, SpO2 98%. Gen: no distress, normal appearing HEENT: oral mucosa pink and moist, NCAT Cardio: Reg rate Chest: normal effort, normal rate of breathing Abd: soft, non-distended Ext: no edema Psych: pleasant, normal affect Skin: intact Neuro: AOx3 Musculoskeletal: 5/5 except for RLE knee extension which is limited by knee pain. +swelling of right knee- improved with knee sleeve  Assessment/Plan: 1. Functional deficits which require 3+ hours per day of interdisciplinary therapy in a comprehensive inpatient rehab setting. Physiatrist is providing close team supervision and 24 hour management of active medical problems listed below. Physiatrist and rehab team continue to assess barriers to discharge/monitor patient progress toward functional and medical goals  Care Tool:  Bathing    Body parts bathed by patient: Right arm, Left arm, Chest, Front perineal area, Abdomen, Buttocks, Right upper leg, Right lower leg, Left upper leg, Left lower leg, Face         Bathing assist Assist Level: Independent     Upper Body Dressing/Undressing Upper body dressing   What is the patient wearing?: Pull over shirt    Upper body assist Assist Level: Independent    Lower Body Dressing/Undressing Lower body dressing  What is the patient wearing?: Underwear/pull up, Pants, Orthosis     Lower body assist Assist for lower body dressing: Supervision/Verbal cueing     Toileting Toileting Toileting Activity did not occur (Clothing management and hygiene only): N/A (no void or bm)  Toileting assist Assist for toileting: Supervision/Verbal cueing     Transfers Chair/bed transfer  Transfers assist     Chair/bed transfer assist level: Independent     Locomotion Ambulation   Ambulation assist      Assist level: Independent Assistive device: No Device Max distance: 1200 ft   Walk 10 feet activity   Assist     Assist level:  Independent Assistive device: No Device   Walk 50 feet activity   Assist    Assist level: Independent Assistive device: No Device    Walk 150 feet activity   Assist    Assist level: Independent Assistive device: No Device    Walk 10 feet on uneven surface  activity   Assist Walk 10 feet on uneven surfaces activity did not occur: Safety/medical concerns (ran out of time to attempt)   Assist level: Independent     Wheelchair     Assist Is the patient using a wheelchair?: No             Wheelchair 50 feet with 2 turns activity    Assist            Wheelchair 150 feet activity     Assist          Blood pressure (!) 130/92, pulse 81, temperature 98.2 F (36.8 C), temperature source Oral, resp. rate 16, height 6' 2 (1.88 m), weight 111.4 kg, SpO2 98%.  Medical Problem List and Plan: 1. 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 If thrombus in right VA resolved will change to antiplatelet agent   -This patient is capable of making decisions on his own behalf.   2. Right knee pain: voltaren  gel scheduled, knee sleeve ordered   3. Hypertension: Resumed home amlodipine  5 mg daily and metoprolol  25 mg bid. Add magnesium  supplement daily-increase to 400mg  daily- continue  4. Constipation: magnesium  oxide ordered daily- increase to 400mg  daily-continue   5.Nonsustained VT Tachycardia: 12/9--6 beat run of VT. Replaced K+ to target. Follow up Labs CMP in a.m.  -Monitor HR per protocol while on metoprolol  25 mg bid.               6. CKD IIIa: Creatinine at baseline, monitor labs.    7. HLD: LDL 100, continue Lipitor 40 mg daily    8. Tobacco Abuse: Educate on cessation. Decrease nicotine  patch to 14mcg  given HTN   9. Cocaine/THC abuse: counseled on cessation.    >30 minutes spent in discharge of patient including review of medications and follow-up appointments, physical examination, and in answering all patient's questions     LOS: 3 days A FACE TO FACE EVALUATION WAS PERFORMED  Sven SQUIBB Vilas Edgerly 02/17/2024, 9:42 AM

## 2024-02-17 NOTE — Progress Notes (Addendum)
 Inpatient Rehabilitation Care Coordinator Discharge Note   Patient Details  Name: Jonathon Harper MRN: [id number] Date of Birth: 1955-09-09   Discharge location: Home with s/o Jonathon Harper  Length of Stay: 2 days  Discharge activity level: Independent  Home/community participation: Active in thr community  Patient response un:Yzjouy Literacy - How often do you need to have someone help you when you read instructions, pamphlets, or other written material from your doctor or pharmacy?: Never  Patient response un:Dnrpjo Isolation - How often do you feel lonely or isolated from those around you?: Never  Services provided included: MD, PT, RD, OT, SLP, RN, CM, TR, Pharmacy, SW  Financial Services:  Field Se[employer] Utilized: Private Insurance [employer] MEDICARE / DREMA DUAL COMPLETE  Choices offered to/list presented to: Patient  Follow-up services arranged:  Outpatient    Outpatient Servicies: [facility]/OT      Patient response to transportation need: Is the patient able to respond to transportation needs?: Yes In the past 12 months, has lack of transportation kept you from medical appointments or from getting medications?: No In the past 12 months, has lack of transportation kept you from meetings, work, or from getting things needed for daily living?: No   Patient/Family verbalized understanding of follow-up arrangements:  Yes  Individual responsible for coordination of the follow-up plan: Patient  Confirmed correct DME delivered: Jonathon  Harper 02/17/2024    Comments (or additional information): Patient able to address all of his care needs with the assistance of his s/o, Jonathon Harper - she will also provide transportation home after she gets off of work at [employer].  Summary of Stay    Date/Time Discharge Planning CSW  02/15/24 1543 Plans to discharge home with his s/o Jonathon Harper. Awaiting therapy follow-up recommendations. No DME needs at present. Letter for  complex to be signed and providng prior to discharge. DS  02/15/24 1542 Plans to discharge home with his s/o Jonathon Harper. Awaiting therapy follow-up recommendations. Letter for complex to be signed and providng prior to discharge. DS       Jonathon  Harper

## 2024-02-17 NOTE — IPOC Note (Signed)
 Overall Plan of Care [facility]) Patient Details Name: Jonathon Harper MRN: [id number] DOB: [birth year]  Admitting Diagnosis: CVA (cerebral vascular accident) 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   Chronic pain of right knee     Functional Problem List: Nursing Safety, Endurance, Medication Management  PT Balance, Edema, Endurance, Motor, Perception  OT Balance, Edema, Safety, Endurance, Motor  SLP  (n/a skilled ST not warranted)  TR         Basic ADLs: OT Bathing, Dressing, Toileting     Advanced  ADLs: OT Laundry, Light Housekeeping     Transfers: PT Bed to Chair, [employer], Floor  OT Toilet, Tub/Shower     Locomotion: PT Ambulation, Stairs     Additional Impairments: OT Fuctional Use of Upper Extremity  SLP        TR      Anticipated Outcomes Item Anticipated Outcome  Self Feeding MOD I  Swallowing      Basic self-care  MOD I  Toileting  MOD I   Bathroom Transfers MOD I  Bowel/Bladder  n/a  Transfers  Mod I  Locomotion  Mod I  Communication     Cognition     Pain  n/a  Safety/Judgment  manage safety w cues   Therapy Plan: PT Intensity: Minimum of 1-2 x/day ,45 to 90 minutes PT Frequency: 5 out of 7 days PT Duration Estimated Length of Stay: 5 - 7 days OT Intensity: Minimum of 1-2 x/day, 45 to 90 minutes OT Frequency: 5 out of 7 days OT Duration/Estimated Length of Stay: 3-5 days SLP Intensity:  (n/a skilled ST not warranted) SLP Frequency:  (n/a skilled ST not warranted) SLP Duration/Estimated Length of Stay: n/a skilled ST not warranted   Team Interventions: Nursing Interventions Patient/Family Education, Medication Management, Discharge Planning, Disease Management/Prevention  PT interventions Ambulation/gait  training, DME/adaptive equipment instruction, Neuromuscular re-education, Stair training, UE/LE Strength taining/ROM, UE/LE Coordination activities, Therapeutic Activities, Pain management, Discharge planning, Balance/vestibular training, Disease management/prevention, Functional mobility training, Patient/family education, Splinting/orthotics, Therapeutic Exercise  OT Interventions Balance/vestibular training, Cognitive remediation/compensation, Community reintegration, Discharge planning, Disease mangement/prevention, DME/adaptive equipment instruction, Functional electrical stimulation, Functional mobility training, Neuromuscular re-education, Pain management, Patient/family education, Psychosocial support, Self Care/advanced ADL retraining, Skin care/wound managment, Splinting/orthotics, Therapeutic Activities, Therapeutic Exercise, UE/LE Strength taining/ROM, UE/LE Coordination activities  SLP Interventions  (n/a skilled ST not warranted)  TR Interventions    SW/CM Interventions Discharge Planning, Psychosocial Support, Patient/Family Education, Disease Management/Prevention   Barriers to Discharge MD  Medical stability  Nursing Home environment access/layout, Decreased caregiver support lilved on 3rd floor apt PTA; plan to d/c home w S.O. 1 level 4 ste bil rails  PT Inaccessible home environment, Decreased caregiver support, Other (comments) (pending knee aspiration)    OT None    SLP  (n/a skilled ST not warranted)    SW       Team Discharge Planning: Destination: PT-Home ,OT- Home , SLP-  Projected Follow-up: PT-Outpatient PT, 24 hour supervision/assistance, OT-  Outpatient OT, SLP-  Projected Equipment Needs: PT-To be determined, OT- To be determined, SLP-  Equipment Details: PT- , OT-  Patient/family involved in discharge planning: PT- Patient,  OT-Patient, SLP-Patient  MD ELOS: 3 days Medical Rehab Prognosis:  Excellent Assessment: The patient has been admitted  for CIR  therapies with the diagnosis of right cerebellar infarct. The team will be addressing functional mobility, strength, stamina, balance, safety, adaptive techniques and equipment, self-care, bowel and bladder mgt, patient and caregiver education. Goals have been set at modI. Anticipated discharge destination is home.        See Team Conference Notes for weekly updates to the plan of care
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
## 2024-03-01 ENCOUNTER — Encounter: Payer: Self-pay | Admitting: *Deleted

## 2024-03-01 ENCOUNTER — Ambulatory Visit: Admitting: Occupational Therapy

## 2024-03-01 DIAGNOSIS — I63011 Cerebral infarction due to thrombosis of right vertebral artery: Secondary | ICD-10-CM | POA: Diagnosis not present

## 2024-03-01 DIAGNOSIS — I69318 Other symptoms and signs involving cognitive functions following cerebral infarction: Secondary | ICD-10-CM | POA: Diagnosis present

## 2024-03-01 DIAGNOSIS — I69351 Hemiplegia and hemiparesis following cerebral infarction affecting right dominant side: Secondary | ICD-10-CM | POA: Insufficient documentation

## 2024-03-01 DIAGNOSIS — R2681 Unsteadiness on feet: Secondary | ICD-10-CM | POA: Diagnosis present

## 2024-03-01 DIAGNOSIS — M6281 Muscle weakness (generalized): Secondary | ICD-10-CM | POA: Insufficient documentation

## 2024-03-01 NOTE — Progress Notes (Signed)
 Patient enrolled for [employer] to ship a 30 day cardiac event monitor to his address on file. Letter with instructions mailed to patient. Dr. Kate to read.

## 2024-03-07 ENCOUNTER — Ambulatory Visit: Admitting: Family Medicine

## 2024-03-07 ENCOUNTER — Other Ambulatory Visit: Payer: Self-pay

## 2024-03-07 VITALS — BP 182/102 | HR 60 | Ht 74.0 in | Wt 248.0 lb

## 2024-03-07 DIAGNOSIS — M25561 Pain in right knee: Secondary | ICD-10-CM

## 2024-03-07 DIAGNOSIS — G8929 Other chronic pain: Secondary | ICD-10-CM

## 2024-03-07 NOTE — Progress Notes (Signed)
 "        I, Ileana Collet, PhD, LAT, ATC acting as a scribe for Artist Lloyd, MD.  Jonathon Harper is a 69 y.o. male who presents to [facility] today for exacerbation of his R knee pain. Pt was last seen by Dr. Lloyd on 01/18/24 and his R knee was aspirated and injected.  Today, pt reports R knee pain returned around early Dec. R knee had been feeling pretty good up until then. He notes +swelling and difficulty walking.   Dx imaging: 11/01/22 R knee XR   Pertinent review of systems: No fevers or chills  Relevant historical information: History of a stroke recently   Exam:  BP (!) 182/102   Pulse 60   Ht 6' 2 (1.88 m)   Wt 248 lb (112.5 kg)   SpO2 97%   BMI 31.84 kg/m  General: Well Developed, well nourished, and in no acute distress.   MSK: Right knee large effusion decreased range of motion.    Lab and Radiology Results  Procedure: Real-time Ultrasound Guided aspiration and injection of right knee Device: [employer] Affiniti 50G/GE Logiq Images permanently stored and available for review in PACS Verbal informed consent obtained.  Discussed risks and benefits of procedure. Warned about infection, bleeding, hyperglycemia damage to structures among others. Patient expresses understanding and agreement Time-out conducted.   Noted no overlying erythema, induration, or other signs of local infection.   Skin prepped in a sterile fashion.   Local anesthesia: Topical Ethyl chloride.   With sterile technique and under real time ultrasound guidance: 2 mL of lidocaine  injected into superior lateral space achieving good anesthesia. Skin again sterilized with isopropyl alcohol. 18-gauge needle used to access the joint effusion. 120 mL of cloudy yellow fluid aspirated. Syringe exchanged and 40 mg of Kenalog  and 2 mL of Marcaine injected to the now decompressed knee joint.. Pain immediately resolved suggesting accurate placement of the medication.   Advised to call  if fevers/chills, erythema, induration, drainage, or persistent bleeding.   Images permanently stored and available for review in the ultrasound unit.  Impression: Technically successful ultrasound guided injection.     EXAM: RIGHT KNEE 3 VIEWS   COMPARISON:  Radiograph 11/01/2022   FINDINGS: Lateral tibiofemoral joint space narrowing. Moderate tricompartmental peripheral spurring. No fracture or erosion. 4.2 cm fluffy calcification in the suprapatellar space appears more medial than on prior exam, consistent with loose body. Small to moderate joint effusion has increased from prior. No erosive or bony destructive change.   IMPRESSION: 1. Tricompartmental osteoarthritis. 2. Small to moderate joint effusion has increased from prior. 3. A 4.2 cm loose body in the suprapatellar space.     Electronically Signed   By: Andrea Gasman M.D.   On: 01/20/2024 19:30 I, Artist Lloyd, personally (independently) visualized and performed the interpretation of the images attached in this note.    Assessment and Plan: 69 y.o. male with right knee pain due to exacerbation of DJD.  Plan for aspiration and injection.  It is possible his knee pain returned faster than expected as he had a stroke and was not able to ambulate normally for a while.  Will repeat aspiration injection today and check back in 3 months.  Ultimately he may need knee replacement or genicular artery embolization.   PDMP not reviewed this encounter. Orders Placed This Encounter  Procedures   Anaerobic and Aerobic Culture    Synovial fluid    Standing Status:   Future  Number of Occurrences:   1    Expiration Date:   03/07/2025   US  LIMITED JOINT SPACE STRUCTURES LOW RIGHT(NO LINKED CHARGES)    Reason for Exam (SYMPTOM  OR DIAGNOSIS REQUIRED):   right knee pain    Preferred imaging location?:   [facility]   Synovial Fluid Analysis, Complete    Synovial fluid    Standing Status:   Future     Number of Occurrences:   1    Expiration Date:   03/07/2025   No orders of the defined types were placed in this encounter.    Discussed warning signs or symptoms. Please see discharge instructions. Patient expresses understanding.   The above documentation has been reviewed and is accurate and complete Artist Lloyd, M.D.   "

## 2024-03-07 NOTE — Patient Instructions (Signed)
Thank you for coming in today.   You received an injection today. Seek immediate medical attention if the joint becomes red, extremely painful, or is oozing fluid.   Check back in 3 months 

## 2024-03-08 ENCOUNTER — Encounter: Attending: Registered Nurse | Admitting: Registered Nurse

## 2024-03-08 ENCOUNTER — Telehealth: Payer: Self-pay | Admitting: Cardiology

## 2024-03-08 ENCOUNTER — Ambulatory Visit: Attending: Student

## 2024-03-08 LAB — SYNOVIAL FLUID ANALYSIS, COMPLETE
Basophils, %: 0 %
Eosinophils-Synovial: 0 % (ref 0–2)
Lymphocytes-Synovial Fld: 80 % — ABNORMAL HIGH (ref 0–74)
Monocyte/Macrophage: 20 % (ref 0–69)
Neutrophil, Synovial: 0 % (ref 0–24)
Synoviocytes, %: 0 % (ref 0–15)
WBC, Synovial: 550 {cells}/uL — ABNORMAL HIGH

## 2024-03-08 NOTE — Telephone Encounter (Signed)
 Jonathon Harper [name] to give critical heart monitor result

## 2024-03-08 NOTE — Telephone Encounter (Signed)
"  ° °  Cardiac Monitor Alert  Date of alert:  03/08/2024   Patient Name: Jonathon Harper  DOB: 02/07/1956  MRN: [id number]   [facility] Cardiologist: None [facility] EP:  None    Monitor Information: Cardiac Event Monitor [[employer]]  Reason:  Acute Cerebellar Stroke Ordering provider:  Waddell Donath PA   Alert Atrial Fibrillation/Flutter This is the 1st alert for this rhythm.  The patient has no hx of Atrial Fibrillation/Flutter.    Anticoagulation medication as of 03/08/2024           apixaban  (ELIQUIS ) 5 MG TABS tablet Take 1 tablet (5 mg total) by mouth 2 (two) times daily.       Next Cardiology Appointment   Date:  03/27/24  Provider:  Dr. Kate  The patient was contacted today.  He is asymptomatic. This was patient's baseline. A. FIB HR 110. Patient on Eliquis , but  he will run out before his visit with Dr. Kate will see if we can give patient a 1 month supply to pick up at the [facility].   Other: Received call after 5:00 pm no DOD to refer to.   Sharlet Lerner Takilma, RN  03/08/2024 5:03 PM   "
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
## 2024-03-09 ENCOUNTER — Ambulatory Visit: Attending: Cardiology

## 2024-03-09 ENCOUNTER — Other Ambulatory Visit (HOSPITAL_COMMUNITY): Payer: Self-pay

## 2024-03-09 ENCOUNTER — Other Ambulatory Visit: Payer: Self-pay

## 2024-03-09 ENCOUNTER — Ambulatory Visit: Payer: Self-pay | Admitting: Family Medicine

## 2024-03-09 DIAGNOSIS — I639 Cerebral infarction, unspecified: Secondary | ICD-10-CM

## 2024-03-09 DIAGNOSIS — G8929 Other chronic pain: Secondary | ICD-10-CM

## 2024-03-09 MED ORDER — ATORVASTATIN CALCIUM 40 MG PO TABS: 40.0000 mg | ORAL_TABLET | Freq: Every day | ORAL | 5 refills | Status: AC

## 2024-03-09 MED ORDER — AMLODIPINE BESYLATE 5 MG PO TABS: 5.0000 mg | ORAL_TABLET | Freq: Every day | ORAL | 5 refills | Status: AC

## 2024-03-09 MED ORDER — METOPROLOL TARTRATE 25 MG PO TABS: 12.5000 mg | ORAL_TABLET | Freq: Two times a day (BID) | ORAL | 5 refills | Status: AC

## 2024-03-09 MED ORDER — DICLOFENAC SODIUM 1 % EX GEL: 2.0000 g | Freq: Four times a day (QID) | CUTANEOUS | 3 refills | Status: AC

## 2024-03-09 MED ORDER — APIXABAN 5 MG PO TABS: 5.0000 mg | ORAL_TABLET | Freq: Two times a day (BID) | ORAL | 0 refills | Status: AC

## 2024-03-09 NOTE — Telephone Encounter (Signed)
 Dr. Floretta DOD reviewed monitor alert; continue to monitor.  Ensure pt is taking Eliquis  as ordered.  Pt was requesting Eliquis  samples yesterday during initial encounter.  Will see if able to get pt samples.

## 2024-03-09 NOTE — Telephone Encounter (Signed)
 Spoke with patient. Advised that eliquis  samples x2 weeks available for pick up. Refilled amlodipine , metoprolol  tartrate and atorvastatin  to [facility] per request.

## 2024-03-09 NOTE — Progress Notes (Signed)
 Knee fluid shows no signs of gout or infection

## 2024-03-13 LAB — ANAEROBIC AND AEROBIC CULTURE
AER RESULT:: NO GROWTH
MICRO NUMBER:: 17431838
MICRO NUMBER:: 17431839
SPECIMEN QUALITY:: ADEQUATE
SPECIMEN QUALITY:: ADEQUATE

## 2024-03-23 ENCOUNTER — Encounter: Payer: Self-pay | Admitting: Adult Health

## 2024-03-23 ENCOUNTER — Ambulatory Visit: Admitting: Adult Health

## 2024-03-23 VITALS — BP 126/85 | HR 66 | Ht 74.0 in | Wt 254.6 lb

## 2024-03-23 DIAGNOSIS — I6501 Occlusion and stenosis of right vertebral artery: Secondary | ICD-10-CM | POA: Diagnosis not present

## 2024-03-23 DIAGNOSIS — I639 Cerebral infarction, unspecified: Secondary | ICD-10-CM | POA: Diagnosis not present

## 2024-03-23 DIAGNOSIS — R0683 Snoring: Secondary | ICD-10-CM

## 2024-03-23 NOTE — Progress Notes (Signed)
 I agree with the above plan

## 2024-03-23 NOTE — Patient Instructions (Signed)
 Your Plan:   Continue Eliquis   Repeat CT angiogram of head/neck. Pending results we may change medications  Blood pressure goal <130/90 Cholesterol LDL goal <70 Diabetes goal A1c <7 Home sleep test ordered Referral from home therapy Monitor diet and try to exercise
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
## 2024-03-27 ENCOUNTER — Ambulatory Visit: Admitting: Cardiology

## 2024-03-30 ENCOUNTER — Ambulatory Visit: Admitting: Internal Medicine

## 2024-04-05 ENCOUNTER — Telehealth: Payer: Self-pay | Admitting: Adult Health

## 2024-04-05 NOTE — Telephone Encounter (Signed)
 HST Devoted pending medicaid no auth req

## 2024-04-10 ENCOUNTER — Other Ambulatory Visit

## 2024-04-10 ENCOUNTER — Inpatient Hospital Stay: Admission: RE | Admit: 2024-04-10

## 2024-06-06 ENCOUNTER — Ambulatory Visit: Admitting: Family Medicine

## 2024-11-16 ENCOUNTER — Ambulatory Visit: Admitting: Adult Health
